# Patient Record
Sex: Female | Born: 1960 | Race: White | Hispanic: No | Marital: Married | Smoking: Former smoker
Health system: Southern US, Community
[De-identification: ages and names within clinical notes are randomized; demographics above are authoritative.]

## PROBLEM LIST (undated history)

## (undated) DIAGNOSIS — I700 Atherosclerosis of aorta: Secondary | ICD-10-CM

## (undated) DIAGNOSIS — D6869 Other thrombophilia: Secondary | ICD-10-CM

## (undated) DIAGNOSIS — J9611 Chronic respiratory failure with hypoxia: Secondary | ICD-10-CM

## (undated) DIAGNOSIS — R0600 Dyspnea, unspecified: Secondary | ICD-10-CM

## (undated) DIAGNOSIS — L519 Erythema multiforme, unspecified: Secondary | ICD-10-CM

## (undated) DIAGNOSIS — N3281 Overactive bladder: Secondary | ICD-10-CM

## (undated) DIAGNOSIS — E039 Hypothyroidism, unspecified: Secondary | ICD-10-CM

## (undated) DIAGNOSIS — I824Z1 Acute embolism and thrombosis of unspecified deep veins of right distal lower extremity: Secondary | ICD-10-CM

## (undated) DIAGNOSIS — J309 Allergic rhinitis, unspecified: Secondary | ICD-10-CM

## (undated) DIAGNOSIS — L03115 Cellulitis of right lower limb: Secondary | ICD-10-CM

## (undated) DIAGNOSIS — F419 Anxiety disorder, unspecified: Secondary | ICD-10-CM

## (undated) DIAGNOSIS — K219 Gastro-esophageal reflux disease without esophagitis: Secondary | ICD-10-CM

## (undated) DIAGNOSIS — G2581 Restless legs syndrome: Secondary | ICD-10-CM

## (undated) DIAGNOSIS — Z9981 Dependence on supplemental oxygen: Secondary | ICD-10-CM

## (undated) DIAGNOSIS — T7840XA Allergy, unspecified, initial encounter: Secondary | ICD-10-CM

## (undated) DIAGNOSIS — G4733 Obstructive sleep apnea (adult) (pediatric): Secondary | ICD-10-CM

## (undated) DIAGNOSIS — R0609 Other forms of dyspnea: Secondary | ICD-10-CM

## (undated) DIAGNOSIS — R0982 Postnasal drip: Secondary | ICD-10-CM

## (undated) DIAGNOSIS — F5104 Psychophysiologic insomnia: Secondary | ICD-10-CM

## (undated) DIAGNOSIS — M1990 Unspecified osteoarthritis, unspecified site: Secondary | ICD-10-CM

## (undated) DIAGNOSIS — J4530 Mild persistent asthma, uncomplicated: Secondary | ICD-10-CM

## (undated) DIAGNOSIS — N959 Unspecified menopausal and perimenopausal disorder: Secondary | ICD-10-CM

## (undated) DIAGNOSIS — F329 Major depressive disorder, single episode, unspecified: Secondary | ICD-10-CM

## (undated) HISTORY — DX: Atherosclerosis of aorta: I70.0

## (undated) HISTORY — DX: Gastro-esophageal reflux disease without esophagitis: K21.9

## (undated) HISTORY — DX: Hypothyroidism, unspecified: E03.9

## (undated) HISTORY — DX: Mild persistent asthma, uncomplicated: J45.30

## (undated) HISTORY — DX: Other forms of dyspnea: R06.09

## (undated) HISTORY — DX: Cellulitis of right lower limb: L03.115

## (undated) HISTORY — DX: Obstructive sleep apnea (adult) (pediatric): G47.33

## (undated) HISTORY — DX: Erythema multiforme, unspecified: L51.9

## (undated) HISTORY — PX: SALIVARY GLAND SURGERY: SHX768

## (undated) HISTORY — DX: Dependence on supplemental oxygen: Z99.81

## (undated) HISTORY — DX: Other thrombophilia: D68.69

## (undated) HISTORY — DX: Psychophysiologic insomnia: F51.04

## (undated) HISTORY — DX: Chronic respiratory failure with hypoxia: J96.11

## (undated) HISTORY — DX: Morbid (severe) obesity due to excess calories: E66.01

## (undated) HISTORY — PX: OTHER SURGICAL HISTORY: SHX169

## (undated) HISTORY — DX: Restless legs syndrome: G25.81

## (undated) HISTORY — DX: Unspecified osteoarthritis, unspecified site: M19.90

## (undated) HISTORY — DX: Major depressive disorder, single episode, unspecified: F32.9

## (undated) HISTORY — DX: Dyspnea, unspecified: R06.00

## (undated) HISTORY — DX: Anxiety disorder, unspecified: F41.9

## (undated) HISTORY — DX: Allergy, unspecified, initial encounter: T78.40XA

## (undated) HISTORY — DX: Acute embolism and thrombosis of unspecified deep veins of right distal lower extremity: I82.4Z1

## (undated) HISTORY — DX: Allergic rhinitis, unspecified: J30.9

## (undated) HISTORY — DX: Overactive bladder: N32.81

## (undated) HISTORY — DX: Postnasal drip: R09.82

## (undated) HISTORY — DX: Unspecified menopausal and perimenopausal disorder: N95.9

---

## 2009-08-20 ENCOUNTER — Ambulatory Visit (HOSPITAL_COMMUNITY): Admission: RE | Admit: 2009-08-20 | Discharge: 2009-08-21 | Payer: Self-pay | Admitting: Neurosurgery

## 2010-06-14 LAB — URINALYSIS, ROUTINE W REFLEX MICROSCOPIC
Glucose, UA: NEGATIVE mg/dL
Hgb urine dipstick: NEGATIVE
Ketones, ur: NEGATIVE mg/dL
Nitrite: NEGATIVE
Urobilinogen, UA: 0.2 mg/dL (ref 0.0–1.0)
pH: 7 (ref 5.0–8.0)

## 2010-06-14 LAB — CBC
Platelets: 233 10*3/uL (ref 150–400)
RBC: 4.59 MIL/uL (ref 3.87–5.11)
RDW: 12.6 % (ref 11.5–15.5)

## 2010-06-14 LAB — BASIC METABOLIC PANEL
BUN: 9 mg/dL (ref 6–23)
CO2: 27 mEq/L (ref 19–32)
Calcium: 8.8 mg/dL (ref 8.4–10.5)
Chloride: 104 mEq/L (ref 96–112)
GFR calc Af Amer: >60 mL/min (ref >60)
Sodium: 137 mEq/L (ref 135–145)

## 2010-06-14 LAB — SURGICAL PCR SCREEN
MRSA, PCR: NEGATIVE
Staphylococcus aureus: POSITIVE — AB

## 2010-06-14 LAB — URINE MICROSCOPIC-ADD ON

## 2012-12-26 ENCOUNTER — Ambulatory Visit (INDEPENDENT_AMBULATORY_CARE_PROVIDER_SITE_OTHER): Payer: BC Managed Care – PPO

## 2012-12-26 VITALS — BP 115/63 | HR 77 | Resp 18

## 2012-12-26 DIAGNOSIS — M722 Plantar fascial fibromatosis: Secondary | ICD-10-CM | POA: Insufficient documentation

## 2012-12-26 DIAGNOSIS — G5762 Lesion of plantar nerve, left lower limb: Secondary | ICD-10-CM | POA: Insufficient documentation

## 2012-12-26 DIAGNOSIS — G5760 Lesion of plantar nerve, unspecified lower limb: Secondary | ICD-10-CM

## 2012-12-26 DIAGNOSIS — M25579 Pain in unspecified ankle and joints of unspecified foot: Secondary | ICD-10-CM

## 2012-12-26 HISTORY — DX: Pain in unspecified ankle and joints of unspecified foot: M25.579

## 2012-12-26 HISTORY — DX: Lesion of plantar nerve, left lower limb: G57.62

## 2012-12-26 HISTORY — DX: Plantar fascial fibromatosis: M72.2

## 2012-12-26 MED ORDER — CELEBREX 200 MG PO CAPS: 200.0000 mg | ORAL_CAPSULE | Freq: Every day | ORAL | Status: DC

## 2012-12-26 MED ORDER — DEHYDRATED ALCOHOL 98 % IV SOLN
1.0000 mL | Freq: Once | INTRAVENOUS | Status: AC
  Administered 2012-12-26: 1 mL via INTRALESIONAL

## 2012-12-26 NOTE — Progress Notes (Signed)
Mrs Mcvicker presents for f/u of neuromas 2nd and 3rd spaces of left foot. A second injection of 4% alcohol in 0.5 % marcaine is deliverred , 1ml to each space. tollerated well , mild discomfort.   Maintain ice packs daily. New rx for Celebrex disp.  fu in 7 days for 3rd in a series of 5 to 7 injections.  Alvan Dame DPM

## 2012-12-26 NOTE — Patient Instructions (Signed)
Celecoxib capsules What is this medicine? CELECOXIB (sell a KOX ib) is a non-steroidal anti-inflammatory drug (NSAID). This medicine is used to treat arthritis and ankylosing spondylitis. It may be also used for pain or painful monthly periods. This medicine may be used for other purposes; ask your health care provider or pharmacist if you have questions. What should I tell my health care provider before I take this medicine? They need to know if you have any of these conditions: -asthma -coronary artery bypass graft (CABG) surgery within the past 2 weeks -drink more than 3 alcohol-containing drinks a day -heart disease or circulation problems like heart failure or leg edema (fluid retention) -high blood pressure -kidney disease -liver disease -stomach bleeding or ulcers -an unusual or allergic reaction to celecoxib, sulfa drugs, aspirin, other NSAIDs, other medicines, foods, dyes, or preservatives -pregnant or trying to get pregnant -breast-feeding How should I use this medicine? Take this medicine by mouth with a full glass of water. Follow the directions on the prescription label. Take it with food if it upsets your stomach or if you take 400 mg at one time. Try to not lie down for at least 10 minutes after you take the medicine. Take the medicine at the same time each day. Do not take more medicine than you are told to take. Long-term, continuous use may increase the risk of heart attack or stroke. A special MedGuide will be given to you by the pharmacist with each prescription and refill. Be sure to read this information carefully each time. Talk to your pediatrician regarding the use of this medicine in children. Special care may be needed. Overdosage: If you think you have taken too much of this medicine contact a poison control center or emergency room at once. NOTE: This medicine is only for you. Do not share this medicine with others. What if I miss a dose? If you miss a dose, take  it as soon as you can. If it is almost time for your next dose, take only that dose. Do not take double or extra doses. What may interact with this medicine? Do not take this medicine with any of the following medications: -cidofovir -methotrexate -other NSAIDs, medicines for pain and inflammation, like ibuprofen or naproxen -pemetrexed This medicine may also interact with the following medications: -alcohol -aspirin and aspirin-like drugs -diuretics -fluconazole -lithium -medicines for high blood pressure -steroid medicines like prednisone or cortisone -warfarin This list may not describe all possible interactions. Give your health care provider a list of all the medicines, herbs, non-prescription drugs, or dietary supplements you use. Also tell them if you smoke, drink alcohol, or use illegal drugs. Some items may interact with your medicine. What should I watch for while using this medicine? Tell your doctor or health care professional if your pain does not get better. Talk to your doctor before taking another medicine for pain. Do not treat yourself. This medicine does not prevent heart attack or stroke. In fact, this medicine may increase the chance of a heart attack or stroke. The chance may increase with longer use of this medicine and in people who have heart disease. If you take aspirin to prevent heart attack or stroke, talk with your doctor or health care professional. Do not take medicines such as ibuprofen and naproxen with this medicine. Side effects such as stomach upset, nausea, or ulcers may be more likely to occur. Many medicines available without a prescription should not be taken with this medicine. This medicine can  cause ulcers and bleeding in the stomach and intestines at any time during treatment. Ulcers and bleeding can happen without warning symptoms and can cause death. What side effects may I notice from receiving this medicine? Side effects that you should report  to your doctor or health care professional as soon as possible: -allergic reactions like skin rash, itching or hives, swelling of the face, lips, or tongue -black or bloody stools, blood in the urine or vomit -blurred vision -breathing problems -chest pain -nausea, vomiting -problems with balance, talking, walking -redness, blistering, peeling or loosening of the skin, including inside the mouth -unexplained weight gain or swelling -unusually weak or tired -yellowing of eyes, skin Side effects that usually do not require medical attention (report to your doctor or health care professional if they continue or are bothersome): -constipation or diarrhea -dizziness -gas or heartburn -upset stomach This list may not describe all possible side effects. Call your doctor for medical advice about side effects. You may report side effects to FDA at 1-800-FDA-1088. Where should I keep my medicine? Keep out of the reach of children. Store at room temperature between 15 and 30 degrees C (59 and 86 degrees F). Keep container tightly closed. Throw away any unused medicine after the expiration date. NOTE: This sheet is a summary. It may not cover all possible information. If you have questions about this medicine, talk to your doctor, pharmacist, or health care provider.  2013, [employer]. (05/13/2009 10:54:17 AM)

## 2013-01-03 ENCOUNTER — Ambulatory Visit (INDEPENDENT_AMBULATORY_CARE_PROVIDER_SITE_OTHER): Payer: BC Managed Care – PPO

## 2013-01-03 VITALS — BP 109/70 | HR 78 | Resp 16

## 2013-01-03 DIAGNOSIS — M722 Plantar fascial fibromatosis: Secondary | ICD-10-CM

## 2013-01-03 DIAGNOSIS — G5760 Lesion of plantar nerve, unspecified lower limb: Secondary | ICD-10-CM

## 2013-01-03 DIAGNOSIS — G5762 Lesion of plantar nerve, left lower limb: Secondary | ICD-10-CM

## 2013-01-03 NOTE — Progress Notes (Signed)
  Subjective:    Patient ID: Darlene Lawson, female    DOB: 1960-09-06, 52 y.o.   MRN: [id number]  HPI patient presents for followup of suspected neuroma second and third intermetatarsal spaces of left foot. Patient had improvement following a steroid and alcohol injections last visit. Presents at this time for her second in a series of 5-7 a call sclerosing injections. Had improvement for several days with recurrence in the last 2 or 3 days if paresthesia and pain. No anterior heel pain is noted at this time plantar fascial symptomology resolved.    Review of Systems  Constitutional: Negative.   HENT: Negative.   Eyes: Negative.   Respiratory: Negative.   Endocrine: Negative.   Genitourinary: Positive for urgency.  Allergic/Immunologic: Negative.   Neurological: Negative.   Hematological: Bruises/bleeds easily.  Psychiatric/Behavioral: Negative.        Objective:   Physical Exam  Constitutional: She is oriented to person, place, and time. She appears well-developed and well-nourished.  Cardiovascular: Intact distal pulses.   Capillary refill 3-4 seconds all digits skin temperature warm bilateral  Musculoskeletal: Normal range of motion.  Muscle strength intact and symmetric bilateral no major osseous abnormalities bilateral mild flexible digital contractures left foot still present. Plantar fascial symptomology nonreproducible no complaint of pain anterior left heel at this time. Still has pain and direct lateral compression second and third intermetatarsal spaces left foot  Neurological: She is alert and oriented to person, place, and time. She has normal strength and normal reflexes.  Epicritic and proprioceptive sensations intact and symmetric bilateral. There is pain and paresthesia on direct compression second and third interspaces left foot consistent with Morton's neuroma. Improvement after last week's injection for several days with recent recurrence of symptoms.  Skin: Skin is  warm and dry. No cyanosis. Nails show no clubbing.  Nails normal trophic skin texture and color and turgor normal hair growth absent. No notable ecchymosis injection sites.  Psychiatric: She has a normal mood and affect. Her behavior is normal. Judgment and thought content normal.          Assessment & Plan:  Morton's neuroma second interspace left foot. Improving with a call sclerosing injections. Plantar fasciitis stable no symptoms at this time.   A second series of injections total of 1 cc 4% dehydration alcohol in 0.5% alcohol is delivered into each of the second and third intermetatarsal space of the left foot. Patient tolerated the injections well. Instructed in ice application daily. Maintain NSAID therapy as needed. Return in 1 week for third injection in the series.  Alvan Dame DPM

## 2013-01-03 NOTE — Patient Instructions (Signed)
ICE INSTRUCTIONS  Apply ice or cold pack to the affected area at least 3 times a day for 10-15 minutes each time.  You should also use ice after prolonged activity or vigorous exercise.  Do not apply ice longer than 20 minutes at one time.  Always keep a cloth between your skin and the ice pack to prevent burns.  Being consistent and following these instructions will help control your symptoms.  We suggest you purchase a gel ice pack because they are reusable and do bit leak.  Some of them are designed to wrap around the area.  Use the method that works best for you.  Here are some other suggestions for icing.   Use a frozen bag of peas or corn-inexpensive and molds well to your body, usually stays frozen for 10 to 20 minutes. Wet a towel with cold water and squeeze out the excess until it's damp.  Place in a bag in the freezer for 20 minutes. Then remove and use.Morton's Neuroma Neuralgia (nerve pain) or neuroma (benign [non-cancerous] nerve tumor) may develop on any interdigital nerve. The interdigital nerves (nerves between digits) of the foot travel beneath and between the metatarsals (long bones of the fore foot) and pass the nerve endings to the toes. The third interdigital is a common place for a small neuroma to form called Morton's neuroma. Another nerve to be affected commonly is the fourth interdigital nerve. This would be in approximately in the area of the base or ball under the bottom of your fourth toe. This condition occurs more commonly in women and is usually on one side. It is usually first noticed by pain radiating (spreading) to the ball of the foot or to the toes. CAUSES The cause of interdigital neuralgia may be from low grade repetitive trauma (damage caused by an accident) as in activities causing a repeated pounding of the foot (running, jumping etc.). It is also caused by improper footwear or recent loss of the fatty padding on the bottom of the foot. TREATMENT  The condition  often resolves (goes away) simply with decreasing activity if that is thought to be the cause. Proper shoes are beneficial. Orthotics (special foot support aids) such as a metatarsal bar are often beneficial. This condition usually responds to conservative therapy, however if surgery is necessary it usually brings complete relief. HOME CARE INSTRUCTIONS   Apply ice to the area of soreness for 15-20 minutes, 3-4 times per day, while awake for the first 2 days. Put ice in a plastic bag and place a towel between the bag of ice and your skin.  Only take over-the-counter or prescription medicines for pain, discomfort, or fever as directed by your caregiver. MAKE SURE YOU:   Understand these instructions.  Will watch your condition.  Will get help right away if you are not doing well or get worse. Document Released: 06/20/2000 Document Revised: 06/06/2011 Document Reviewed: 03/14/2005 [facility] Patient Information 2014 [place], [place].

## 2013-01-09 ENCOUNTER — Ambulatory Visit (INDEPENDENT_AMBULATORY_CARE_PROVIDER_SITE_OTHER): Payer: BC Managed Care – PPO

## 2013-01-09 VITALS — BP 122/73 | HR 75 | Resp 18

## 2013-01-09 DIAGNOSIS — G5760 Lesion of plantar nerve, unspecified lower limb: Secondary | ICD-10-CM

## 2013-01-09 DIAGNOSIS — G5762 Lesion of plantar nerve, left lower limb: Secondary | ICD-10-CM

## 2013-01-09 NOTE — Patient Instructions (Signed)

## 2013-01-09 NOTE — Progress Notes (Signed)
  Subjective:    Patient ID: Darlene Lawson, female    DOB: 10-24-1960, 52 y.o.   MRN: [id number]  HPI yesterday i started feeling pain, not too bad but if i stepped wrong i can feel it and i have been icing it. Patient had one steroid injection in his had 2 weeks of alcohol injections thus far. Continues to have pain on direct lateral compression second and third intermetatarsal spaces of left foot. There is no he'll arch pain at this time responding well.    Review of Systems  Constitutional: Negative.   HENT: Negative.   Eyes: Negative.   Respiratory: Negative.   Cardiovascular: Negative.   Gastrointestinal: Negative.   Endocrine: Negative.   Genitourinary: Negative.   Musculoskeletal: Negative.   Skin: Negative.   Allergic/Immunologic: Negative.   Neurological: Negative.   Hematological: Negative.   Psychiatric/Behavioral: Negative.        Objective:   Physical Exam Neurovascular status is intact. Pedal pulses palpable epicritic and proprioceptive sensations intact. There is hyperesthesia direct compression second and third interspaces left foot consistent with Morton's neuroma. Patient had relief temporarily following the injections however by the end of the week pain symptoms recurred. She also the patient has semirigid digital contractures 2 through 4 left foot. Maintain appropriate accommodative shoes.       Assessment & Plan:  Assessment Morton's neuroma second and third metatarsal spaces left foot. Injection 1 cc 4% dehydrated alcohol and 0.5% Marcaine plain delivered into each of the second and third interspaces of the left foot. This block delivered to the second and third common plantar digital nerves. Patient will continue with ice compress and Celebrex as instructed, and maintain accommodative shoes. Reappointed one week for fourth series of injections. Next  Tana Felts DPM

## 2013-01-16 ENCOUNTER — Ambulatory Visit (INDEPENDENT_AMBULATORY_CARE_PROVIDER_SITE_OTHER): Payer: BC Managed Care – PPO

## 2013-01-16 VITALS — BP 124/79 | HR 83 | Resp 18

## 2013-01-16 DIAGNOSIS — G5762 Lesion of plantar nerve, left lower limb: Secondary | ICD-10-CM

## 2013-01-16 DIAGNOSIS — M722 Plantar fascial fibromatosis: Secondary | ICD-10-CM

## 2013-01-16 DIAGNOSIS — G5760 Lesion of plantar nerve, unspecified lower limb: Secondary | ICD-10-CM

## 2013-01-16 NOTE — Patient Instructions (Signed)

## 2013-01-16 NOTE — Progress Notes (Signed)
  Subjective:    Patient ID: Darlene Lawson, female    DOB: [birth year], 52 y.o.   MRN: [id number]  HPI Better than we was and the closer we get to getting a shot i can tell its there Patient presents for fourth a series of 57 alcohol injections for suspect Morton's neuroma second and third interspace left foot  Review of Systems  Constitutional: Negative.   HENT: Negative.   Eyes: Negative.   Respiratory: Negative.   Cardiovascular: Negative.   Gastrointestinal: Negative.   Endocrine: Negative.   Genitourinary: Negative.   Musculoskeletal: Negative.   Skin: Negative.   Allergic/Immunologic: Negative.   Neurological: Negative.   Hematological: Negative.   Psychiatric/Behavioral: Negative.        Objective:   Physical Exam Neurovascular status unchanged. No notable ecchymosis or edema. Following the third injection there is significant improvement up until late Monday and early Tuesday when some neuropathy pain started coming back. Still some mild paresthesia and neuroma symptoms on direct lateral compression second and third interspace although not as intense. Decreased sensation in the third web spaces noted.       Assessment & Plan:  Improving symptomology with alcohol sclerosing injections. A fourth in a series of 5-7 alcohol injections delivered at this time. A total of 1 cc of 4% dehydrated alcohol and 0.5% Marcaine plain is infiltrated into each of the second and third intermetatarsal space of left foot. Patient tolerated the injection well, and will continue icing the area and maintain Celebrex/Tylenol for pain. Reappointed in one week for a fifth injection after which a one-month break will be utilized. We'll consider sixth or seventh injections based on improvement her progress at that time. Next  Alvan Dame DPM

## 2013-01-23 ENCOUNTER — Ambulatory Visit (INDEPENDENT_AMBULATORY_CARE_PROVIDER_SITE_OTHER): Payer: BC Managed Care – PPO

## 2013-01-23 VITALS — BP 119/69 | HR 79 | Resp 18

## 2013-01-23 DIAGNOSIS — L600 Ingrowing nail: Secondary | ICD-10-CM

## 2013-01-23 DIAGNOSIS — G5762 Lesion of plantar nerve, left lower limb: Secondary | ICD-10-CM

## 2013-01-23 DIAGNOSIS — G5760 Lesion of plantar nerve, unspecified lower limb: Secondary | ICD-10-CM

## 2013-01-23 DIAGNOSIS — L03039 Cellulitis of unspecified toe: Secondary | ICD-10-CM

## 2013-01-23 MED ORDER — CLINDAMYCIN HCL 150 MG PO CAPS: 150.0000 mg | ORAL_CAPSULE | Freq: Three times a day (TID) | ORAL | Status: DC

## 2013-01-23 NOTE — Patient Instructions (Signed)

## 2013-01-23 NOTE — Progress Notes (Signed)
  Subjective:     Patient ID: Darlene Lawson, female    DOB: August 30, 1960, 52 y.o.   MRN: [id number] The left 2nd and 3rd toes are hurting when i am walking and using the ice packs and it better but i  HPI can feel it and the 2nd toe on the right has a place on the nail and has been going on for about 6 months and i get pedicures and is sore to touch  Patients have several month history of ingrowing nail of her medial border second toe right foot there's been no discharge or drainage however is been swollen and tender. This started after she started having pedicures.   Review of Systems deferred at this time     Objective:   Physical Exam Neurovascular status unchanged. Pedal pulses palpable dorsalis pedis and PT bilateral. There continues to pain second and third toes second interspace left foot. Slight edema and erythema medial border second toe right foot no discharge or drainage noted. There continues to be pain on direct lateral compression of the left foot second third intermetatarsal spaces no ecchymosis is noted.    Assessment & Plan:  #1 Morton's neuroma patient presents this time for fifth injection alcohol sclerosing agent. A fifth injection total of 1 cc 4% dehydrated alcohol and 0.5% Marcaine is infiltrated to each of the second and third interspaces of the left foot. Recommend a six-week followup for possible sixth or seventh injections based on progress and findings at that time. If he fails to improve additional injections and recommended, or possible surgical polyps may also recommended.  #2 ingrowing nail second toe right foot: Patient presents with a complaint of ingrown second toenail with possibly early paronychia at this time recommended daily soaks in soap and water or Epsom salts in warm water. Prescription for clindamycin was dispensed 3 times a day x10 days 150 mg. Followup with the next 4-6 weeks for reevaluation. Discussed briefly the possibility of a nail partial nail  excision if needed  Alvan Dame DPM

## 2013-02-25 HISTORY — PX: LAMINECTOMY: SHX219

## 2013-03-06 ENCOUNTER — Ambulatory Visit: Payer: BC Managed Care – PPO

## 2013-07-16 ENCOUNTER — Other Ambulatory Visit: Payer: Self-pay

## 2014-02-24 ENCOUNTER — Ambulatory Visit (INDEPENDENT_AMBULATORY_CARE_PROVIDER_SITE_OTHER): Payer: BC Managed Care – PPO

## 2014-02-24 VITALS — BP 108/68 | HR 89 | Resp 18

## 2014-02-24 DIAGNOSIS — M2012 Hallux valgus (acquired), left foot: Secondary | ICD-10-CM

## 2014-02-24 DIAGNOSIS — M2042 Other hammer toe(s) (acquired), left foot: Secondary | ICD-10-CM

## 2014-02-24 DIAGNOSIS — R52 Pain, unspecified: Secondary | ICD-10-CM

## 2014-02-24 DIAGNOSIS — G5762 Lesion of plantar nerve, left lower limb: Secondary | ICD-10-CM

## 2014-02-24 DIAGNOSIS — M722 Plantar fascial fibromatosis: Secondary | ICD-10-CM

## 2014-02-24 NOTE — Progress Notes (Signed)
   Subjective:    Patient ID: Darlene Lawson, female    DOB: [birth year], 53 y.o.   MRN: [id number]  HPI MY LEFT FOOT IS NO BETTER AND IT HURTS ON THE 2ND AND 3RD TOES AND HAS A WEIRD FEELING AND ON THE BALL OF MY LEFT FOOT IT FEELS LIKE THE BONE IS COMING OUT AND I WEAR WIDE SHOES AND THE 4TH AND 5TH TOES ARE STARTING TO HURT    Review of Systems no new findings or systemic changes noted     Objective:   Physical Exam 53 year old white female well-developed well-nourished oriented 3 presents at this time for follow-up patient's had a series of 5 alcohol injections to the left second and third interspace for Morton's neuroma however indicates her foot still is painful as never she having pain up in the toes to the rigid contractures of the second and third toes she to walk inside of her foot neck she aggravated the fourth and fifth toe as a result of the gait changes patient and these have notable bunion deformity as well as lateral deviation hallux as well as dorsal displacement of the second and third toes with pain and capsulitis at the MTP as well as IP joints of the toes being noted. Next  Neurovascular status is intact pedal pulses are palpable DP +2 PT +2 over 4 Refill time 3 seconds epicritic and proprioceptive sensations intact hyperesthesia to the toes being noted. 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 Additional sesamoid position 4-5 there is adductovarus rotation of the fourth and fifth toes are are flexible and asymptomatic at current time. 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 with  capsulitis of the MTP joints as well as suspect neuroma symptomology second and third interspaces continues to be a problem plan at this time patient is requesting surgical intervention help to leaving promise completely does not require any additional injections therefore patient declines injections and wished to proceed with surgery at this time a consent form for [facility] repair second and third left with sequential reductions and neurectomy second and third interspaces left foot risk L Turner's reviewed all questions asked by the patient are answered there no contraindications to surgery and surgery rescheduled her convenience with appropriate follow-up thereafter she'll be out of work for at least 6-8 weeks as instructed will be in air fracture boot for at least 6 weeks. There we pins in the second and third toes as well as buried K wire in the first metatarsal. There were medications and surgery will be scheduled  Harriet Masson DPM

## 2014-02-24 NOTE — Patient Instructions (Addendum)
Pre-Operative Instructions  Congratulations, you have decided to take an important step to improving your quality of life.  You can be assured that the doctors of [facility] will be with you every step of the way.  1. Plan to be at the surgery center/hospital at least 1 (one) hour prior to your scheduled time unless otherwise directed by the surgical center/hospital staff.  You must have a responsible adult accompany you, remain during the surgery and drive you home.  Make sure you have directions to the surgical center/hospital and know how to get there on time. 2. For hospital based surgery you will need to obtain a history and physical form from your family physician within 1 month prior to the date of surgery- we will give you a form for you primary physician.  3. We make every effort to accommodate the date you request for surgery.  There are however, times where surgery dates or times have to be moved.  We will contact you as soon as possible if a change in schedule is required.   4. No Aspirin/Ibuprofen for one week before surgery.  If you are on aspirin, any non-steroidal anti-inflammatory medications (Mobic, Aleve, Ibuprofen) you should stop taking it 7 days prior to your surgery.  You make take Tylenol  For pain prior to surgery.  5. Medications- If you are taking daily heart and blood pressure medications, seizure, reflux, allergy, asthma, anxiety, pain or diabetes medications, make sure the surgery center/hospital is aware before the day of surgery so they may notify you which medications to take or avoid the day of surgery. 6. No food or drink after midnight the night before surgery unless directed otherwise by surgical center/hospital staff. 7. No alcoholic beverages 24 hours prior to surgery.  No smoking 24 hours prior to or 24 hours after surgery. 8. Wear loose pants or shorts- loose enough to fit over bandages, boots, and casts. 9. No slip on shoes, sneakers are best. 10. Bring  your boot with you to the surgery center/hospital.  Also bring crutches or a walker if your physician has prescribed it for you.  If you do not have this equipment, it will be provided for you after surgery. 11. If you have not been contracted by the surgery center/hospital by the day before your surgery, call to confirm the date and time of your surgery. 12. Leave-time from work may vary depending on the type of surgery you have.  Appropriate arrangements should be made prior to surgery with your employer. 13. Prescriptions will be provided immediately following surgery by your doctor.  Have these filled as soon as possible after surgery and take the medication as directed. 14. Remove nail polish on the operative foot. 15. Wash the night before surgery.  The night before surgery wash the foot and leg well with the antibacterial soap provided and water paying special attention to beneath the toenails and in between the toes.  Rinse thoroughly with water and dry well with a towel.  Perform this wash unless told not to do so by your physician.  Enclosed: 1 Ice pack (please put in freezer the night before surgery)   1 Hibiclens skin cleaner   Pre-op Instructions  If you have any questions regarding the instructions, do not hesitate to call our office.  [place]: [address] [place], [place] [address] [phone]  [place]: [address] [place], [place] [address] [phone]  [place]: [address]  [place], [place] [address] [phone]  Dr. Keitra Carusone   Tuchman DPM, Dr. Ila Mcgill DPM Dr. Harriet Masson DPM, Dr. Lanelle Bal DPM, Dr. Trudie Buckler DPM     ICE INSTRUCTIONS  Apply ice or cold pack to the affected area at least 3 times a day for 10-15 minutes each time.  You should also use ice after prolonged activity or vigorous exercise.  Do not apply ice longer than 20 minutes at one time.  Always keep a cloth between your skin and the ice pack to prevent burns.  Being consistent and  following these instructions will help control your symptoms.  We suggest you purchase a gel ice pack because they are reusable and do bit leak.  Some of them are designed to wrap around the area.  Use the method that works best for you.  Here are some other suggestions for icing.   Use a frozen bag of peas or corn-inexpensive and molds well to your body, usually stays frozen for 10 to 20 minutes.  Wet a towel with cold water and squeeze out the excess until it's damp.  Place in a bag in the freezer for 20 minutes. Then remove and use.

## 2014-04-09 DIAGNOSIS — M2012 Hallux valgus (acquired), left foot: Secondary | ICD-10-CM

## 2014-04-09 DIAGNOSIS — G5762 Lesion of plantar nerve, left lower limb: Secondary | ICD-10-CM

## 2014-04-09 DIAGNOSIS — M2042 Other hammer toe(s) (acquired), left foot: Secondary | ICD-10-CM

## 2014-04-17 ENCOUNTER — Ambulatory Visit (INDEPENDENT_AMBULATORY_CARE_PROVIDER_SITE_OTHER): Payer: BLUE CROSS/BLUE SHIELD

## 2014-04-17 VITALS — BP 117/79 | HR 71 | Resp 18

## 2014-04-17 DIAGNOSIS — M2012 Hallux valgus (acquired), left foot: Secondary | ICD-10-CM

## 2014-04-17 DIAGNOSIS — D3610 Benign neoplasm of peripheral nerves and autonomic nervous system, unspecified: Secondary | ICD-10-CM

## 2014-04-17 DIAGNOSIS — Z09 Encounter for follow-up examination after completed treatment for conditions other than malignant neoplasm: Secondary | ICD-10-CM

## 2014-04-17 DIAGNOSIS — M2042 Other hammer toe(s) (acquired), left foot: Secondary | ICD-10-CM

## 2014-04-17 NOTE — Progress Notes (Signed)
   Subjective:    Patient ID: Darlene Lawson, female    DOB: [birth year], 54 y.o.   MRN: [id number]  HPI I HAD SURGERY ON 04/09/14 ON MY LEFT FOOT AND IT FEELS LIKE THERE IS SOMETHING ON THE BOTTOM OF MY FOOT AND THE PAIN MEDICINE IS NOT THAT GREAT    Review of Systems no new findings or systemic changes noted     Objective:   Physical Exam Neurovascular status is intact pedal pulses are palpable patient is a day status post [place] bunionectomy left foot as well as hammertoe repair second and third left and neurectomy second and third interspace left foot incisions clean dry well coapted dressings intact and dry. No dehiscence no discharge no drainage noted. The and ecchymosis consistent with postop course noted. X-rays reveal good position alignment of the osteotomies and K wire fixations. Clinically toes appear to good rectus position and alignment as does the hallux. No open wounds no ulcers. Patient is having some slight aching and throbbing assistant with postop swelling and edema and tight bandaging this time was bandage was changed had some improvement in pain sensation will advised to take some at 2 Advil 3 times a day as needed for pain and for throbbing also elevated and ice as recommended. Reappointed one week plan for suture removal within the next X she proxy 10 or 11 days for suture removal.       Assessment & Plan:  Assessment good postop progress following bunionectomy and hammertoe repair and neurectomy left foot maintain Advil and ice elevate when possible dressed a dressing reapplied maintain air fracture boot as instructed pins will be in place for at least another 4 more weeks suture removal within the next week or 10 days. Recommended Tylenol or Advil as needed for pain patient ambulating comfortably with the boot intact next  Harriet Masson DPM

## 2014-04-17 NOTE — Patient Instructions (Signed)

## 2014-04-28 ENCOUNTER — Ambulatory Visit (INDEPENDENT_AMBULATORY_CARE_PROVIDER_SITE_OTHER): Payer: BLUE CROSS/BLUE SHIELD

## 2014-04-28 DIAGNOSIS — Z09 Encounter for follow-up examination after completed treatment for conditions other than malignant neoplasm: Secondary | ICD-10-CM

## 2014-04-28 DIAGNOSIS — M2012 Hallux valgus (acquired), left foot: Secondary | ICD-10-CM

## 2014-04-28 DIAGNOSIS — M2042 Other hammer toe(s) (acquired), left foot: Secondary | ICD-10-CM

## 2014-04-28 DIAGNOSIS — L600 Ingrowing nail: Secondary | ICD-10-CM

## 2014-04-28 DIAGNOSIS — D3610 Benign neoplasm of peripheral nerves and autonomic nervous system, unspecified: Secondary | ICD-10-CM

## 2014-04-28 NOTE — Patient Instructions (Signed)
ICE INSTRUCTIONS  Apply ice or cold pack to the affected area at least 3 times a day for 10-15 minutes each time.  You should also use ice after prolonged activity or vigorous exercise.  Do not apply ice longer than 20 minutes at one time.  Always keep a cloth between your skin and the ice pack to prevent burns.  Being consistent and following these instructions will help control your symptoms.  We suggest you purchase a gel ice pack because they are reusable and do bit leak.  Some of them are designed to wrap around the area.  Use the method that works best for you.  Here are some other suggestions for icing.   Use a frozen bag of peas or corn-inexpensive and molds well to your body, usually stays frozen for 10 to 20 minutes.  Wet a towel with cold water and squeeze out the excess until it's damp.  Place in a bag in the freezer for 20 minutes. Then remove and use.   Patient may resume normal bathing and hygiene wash the foot daily with soap and water reapply Coflex wrap to the toes buddy wrapping toes 23 and 4 as instructed. Also maintain compression stocking every day may remove the stocking in the evening to have wash and air dry reapply the Sarles stocking every day to help keep down and swelling. Maintain Neosporin are cocoa butter to the incision areas to help reduce scarring. Maintain boot at all times for walking or standing.

## 2014-04-28 NOTE — Progress Notes (Signed)
   Subjective:    Patient ID: Darlene Lawson, female    DOB: 1961-02-11, 54 y.o.   MRN: [id number]  HPI I AM STILL HAVE SOME SWELLING     Review of Systems no new findings or systemic changes noted    Objective:   Physical Exam Neurovascular status is intact pedal pulses are palpable incisions clean dry well coapted dressings are intact and dry sutures removed at this time. Neosporin applied to the incision areas and pin sites Coflex wrapping of toes 23 and 4 and a buddy wrap fashion was done and Coflex is dispensed to be maintained by patient daily. May resume normal bathing and hygiene however maintain air fracture boot for ambulation and standing. Reappointed in 2-3 weeks for follow-up x-ray and likely pin removal at that time. At this time patient is also advised to keep the foot elevated and maintain compression anklet which is dispensed to help with the swelling and edema which is still present. Elevated ice and maintain anklet everyday maintain wash the anklet daily and reapply in the morning.       Assessment & Plan:  Assessment good postop progress neurovascular status is intact incision is well coapted no dehiscence no discharge no drainage dispensed anklet and Coflex wrapping to maintain compression may resume normal bathing and hygiene reappointed 2-3 weeks plan for follow-up x-ray and pin removal at that time. Blenda Mounts DPM

## 2014-05-02 DIAGNOSIS — M2012 Hallux valgus (acquired), left foot: Secondary | ICD-10-CM

## 2014-05-12 ENCOUNTER — Ambulatory Visit (INDEPENDENT_AMBULATORY_CARE_PROVIDER_SITE_OTHER): Payer: BLUE CROSS/BLUE SHIELD

## 2014-05-12 VITALS — BP 122/77 | HR 83 | Resp 18

## 2014-05-12 DIAGNOSIS — R52 Pain, unspecified: Secondary | ICD-10-CM

## 2014-05-12 DIAGNOSIS — Z09 Encounter for follow-up examination after completed treatment for conditions other than malignant neoplasm: Secondary | ICD-10-CM

## 2014-05-12 DIAGNOSIS — L600 Ingrowing nail: Secondary | ICD-10-CM

## 2014-05-12 DIAGNOSIS — M2012 Hallux valgus (acquired), left foot: Secondary | ICD-10-CM

## 2014-05-12 DIAGNOSIS — M2042 Other hammer toe(s) (acquired), left foot: Secondary | ICD-10-CM

## 2014-05-12 MED ORDER — CLINDAMYCIN HCL 150 MG PO CAPS: 150.0000 mg | ORAL_CAPSULE | Freq: Three times a day (TID) | ORAL | Status: DC

## 2014-05-12 NOTE — Patient Instructions (Signed)

## 2014-05-12 NOTE — Progress Notes (Signed)
   Subjective:    Patient ID: Darlene Lawson, female    DOB: 1960-05-14, 54 y.o.   MRN: [id number]  HPI MY LEFT FOOT IS STILL SWELLING AND IS STILL SORE AND TENDER AND IS NUMB IN SOME AREAS AND MY RIGHT BIG TOENAIL IS INGROWN AND I THINK WE ARE DOING THAT TODAY AS WELL    Review of Systems no new findings or systemic changes noted.     Objective:   Physical Exam D54-year-old feel since this time she is 5 weeks status post bunionectomy of left foot as well as hammertoe repair second third toes left foot with K wire fixation. X-rays reveal good position alignment of the osteotomy and fixation intact, second and third toes. This time patient ready for pin removal again incisions well coapted pin sites well no dehiscence no discharge drainage no other complaint patient does have is a summary from her surgery on her left foot if she wants to have her ingrowing nail medial lateral borders of the right hallux nail removed this is an ongoing problem pain with discomfort shoes and activities and ambulation we discussed this previously and surgery for the nail to take place today following her postop check. Neurovascular status is intact bilateral pedal pulses are palpable DP +2 PT plus one over 4 bilateral capillary refill time 3 seconds all digits epicritic and proprioceptive sensations intact and symmetric. There is normal plantar response DTRs not listed this time patient pin sites on the left foot are prepped with Betadine Neosporin pins are removed Neosporin and Band-Aid dressings applied with Coflex wrapping of buddy wrapping toes 23 and 4 carried out on the left foot new ankle is also dispensed to maintain compression left foot may discontinue air fracture boot and return to walking tennis or athletic shoes she wearing Birkenstocks today. Graft on the right foot patient does have pedal pulses are palpable ingrowing criptotic incurvated friable nail first right. At this time per patient request my  recommendation will undergo AP nail procedure with phenol matricectomy we discussed the risks, case is postop care involving daily soaks and Betadine warm water and also clindamycin for 10 days as instructed.       Assessment & Plan:  Assessment #1 good postop progress left foot following bunionectomy and hammertoe repair pins are removed discontinue boot and return to walking tennis or athletic shoe return in 2 months for long-term follow-up on the left foot. Maintain moderate activities continue with aggressive and active range of motion exercises of the great toe on left foot. Neck  Assessment #2 ingrowing nail right great toe. This time local anesthetic block symmetrical to the right great toe Betadine first performed medial lateral borders are excised phenol matricectomy followed by alcohol wash Betadine ointment and dry sterile dressing applied to the right great toe. Soak in daily as instructed Epson salts or Betadine in warm water soaks. Tylenol aspirin or Advil as needed for pain return in proxy 2-3 weeks for nail check on the right great toe. Prescription for clindamycin 150 mg dispensed 30. 1 by mouth 3 times a day.  Harriet Masson DPM

## 2014-05-26 ENCOUNTER — Ambulatory Visit (INDEPENDENT_AMBULATORY_CARE_PROVIDER_SITE_OTHER): Payer: BLUE CROSS/BLUE SHIELD

## 2014-05-26 VITALS — BP 116/72 | HR 90 | Temp 99.6°F | Resp 18

## 2014-05-26 DIAGNOSIS — Z09 Encounter for follow-up examination after completed treatment for conditions other than malignant neoplasm: Secondary | ICD-10-CM

## 2014-05-26 DIAGNOSIS — M2012 Hallux valgus (acquired), left foot: Secondary | ICD-10-CM

## 2014-05-26 DIAGNOSIS — M2042 Other hammer toe(s) (acquired), left foot: Secondary | ICD-10-CM

## 2014-05-26 DIAGNOSIS — D3610 Benign neoplasm of peripheral nerves and autonomic nervous system, unspecified: Secondary | ICD-10-CM

## 2014-05-26 DIAGNOSIS — L600 Ingrowing nail: Secondary | ICD-10-CM

## 2014-05-26 NOTE — Progress Notes (Signed)
   Subjective:    Patient ID: Darlene Lawson, female    DOB: Mar 01, 1961, 54 y.o.   MRN: [id number]  HPI MY RIGHT TOENAIL IS DOING BETTER IT WAS RED AND DRAINING AND ALL OF A SUDDEN MY LEFT FOOT IS SWELLING BAD AND THE 2ND TOE SEEMS TO BE TURNING TOWARD MY 3RD TOE    Review of Systems no new findings or systemic changes     Objective:   Physical Exam Neurovascular status is intact pedal pulses are palpable patient is two-week status post AP nail medial lateral border the right great toe doing well mild serous drainage still present Neosporin and Band-Aids applied maintain Neosporin and Band-Aid during the day where dry at night. As far as the left foot patient has some increased swelling in the toes although patient is has a slight fever 99.6. Patient just feels some generalized malaise there is no increased temperature fever on either foot I cellulitis or lymphangitis noted. Dema the toes consistent with postop course although clinically the foot is doing well good range of motion of the hallux and patient had healed incisions between the second third and third and fourth toes. Again patient is undergone neurectomy as well as hammertoe repairs multiple digits continue with Coflex wrapping additional cancer anklets are dispensed to maintain compression.       Assessment & Plan:  Assessment good postoperative for AP nail procedure maintain Neosporin and Band-Aid during the day where dry at night continue with soaking his lungs or drainage. Recheck in 2 months for long-term follow-up on the left foot following bunion and hammertoe surgeries maintain Coflex wrapping maintain anklet for compression contact us immediately if is any changes or exacerbations at any time monitor her temperature any fever chills contact us immediately or go to the emergency room fitters any cellulitis lymphangitis or any fever chills whether is a foot related a respiratory issue dressed. Otherwise indicates her diabetes is  been stable recheck in 2 months for long-term follow-up and x-ray  Harriet Masson DPM

## 2014-07-01 MED ORDER — CELECOXIB 200 MG PO CAPS: 200.0000 mg | ORAL_CAPSULE | Freq: Two times a day (BID) | ORAL | Status: DC

## 2014-07-01 NOTE — Addendum Note (Signed)
Addended by: Cranford Mon R on: 07/01/2014 04:20 PM   Modules accepted: Orders

## 2014-07-10 ENCOUNTER — Encounter: Payer: Self-pay | Admitting: Podiatrist

## 2014-07-10 ENCOUNTER — Ambulatory Visit (INDEPENDENT_AMBULATORY_CARE_PROVIDER_SITE_OTHER): Payer: BLUE CROSS/BLUE SHIELD

## 2014-07-10 ENCOUNTER — Ambulatory Visit (INDEPENDENT_AMBULATORY_CARE_PROVIDER_SITE_OTHER): Payer: BLUE CROSS/BLUE SHIELD | Admitting: Podiatrist

## 2014-07-10 VITALS — BP 112/77 | HR 83 | Resp 18

## 2014-07-10 DIAGNOSIS — D3610 Benign neoplasm of peripheral nerves and autonomic nervous system, unspecified: Secondary | ICD-10-CM

## 2014-07-10 DIAGNOSIS — M2012 Hallux valgus (acquired), left foot: Secondary | ICD-10-CM

## 2014-07-10 DIAGNOSIS — Z09 Encounter for follow-up examination after completed treatment for conditions other than malignant neoplasm: Secondary | ICD-10-CM

## 2014-07-11 NOTE — Progress Notes (Signed)
Patient presents today for final follow-up status post [facility] bunion correction of the left foot with hammertoe repair digits 2 and 3 of the left foot date of surgery was 04/09/2014. She states overall she is doing very well and is happy with the procedure.  Objective neurovascular status is intact excellent clinical and radiographic alignment of the first metatarsophalangeal joint as well as the second third metatarsophalangeal joint greater range of motion is noted left foot. Incision sites are well healed. Minimal swelling is noted.  Assessment: Status post left foot surgery  Plan: Recommended continued range of motion exercises in good supportive shoes. She may return her normal activities as tolerated. At this time she is discharged from her postoperative course. If any concerns arise she will call.

## 2014-08-18 ENCOUNTER — Telehealth: Payer: Self-pay

## 2014-08-18 NOTE — Telephone Encounter (Signed)
Patient would like a CB requested to talk to [place] regarding her foot

## 2014-08-19 NOTE — Telephone Encounter (Signed)
Called patient and stated that if she needed to speak with me just to call the [place] office and ask for me. Kyna Blahnik

## 2014-10-01 ENCOUNTER — Ambulatory Visit (INDEPENDENT_AMBULATORY_CARE_PROVIDER_SITE_OTHER): Payer: BLUE CROSS/BLUE SHIELD | Admitting: Podiatry

## 2014-10-01 ENCOUNTER — Encounter: Payer: Self-pay | Admitting: Podiatry

## 2014-10-01 ENCOUNTER — Ambulatory Visit: Payer: BLUE CROSS/BLUE SHIELD

## 2014-10-01 VITALS — BP 131/84 | HR 82 | Resp 12

## 2014-10-01 DIAGNOSIS — M779 Enthesopathy, unspecified: Secondary | ICD-10-CM

## 2014-10-01 DIAGNOSIS — Z09 Encounter for follow-up examination after completed treatment for conditions other than malignant neoplasm: Secondary | ICD-10-CM

## 2014-10-01 DIAGNOSIS — M2042 Other hammer toe(s) (acquired), left foot: Secondary | ICD-10-CM

## 2014-10-01 MED ORDER — METHYLPREDNISOLONE 4 MG PO TBPK: ORAL_TABLET | ORAL | Status: DC

## 2014-10-01 NOTE — Progress Notes (Signed)
Subjective:     Patient ID: Darlene Lawson, female   DOB: [birth year], 54 y.o.   MRN: [id number]  HPIThis patient returns to the office saying she has severe pain in her left forefoot.  She had [place] bunionectom, hammer toe correction 2,3 left  in January.  She initially was doing well but then she developed pain in second and third toes left foot and behind her toe joints.  She was seen by Dr. Valentina Lucks who diagnosed a capsulitis and treated her with injection therapy.  This helped temporarily.  She now feels that her bones are coming through the bottom of her foot.It is painful to walk.  She presents for evaluation and treatment.   Review of Systems     Objective:   Physical Exam Objective: Review of past medical history, medications, social history and allergies were performed.  Vascular: Dorsalis pedis and posterior tibial pulses were palpable B/L, capillary refill was  WNL B/L, temperature gradient was WNL B/L   Skin:  No signs of symptoms of infection or ulcers on both feet  Nails: appear healthy with no signs of mycosis or infections  Sensory: Semmes Weinstein monifilament WNL   Orthopedic: Orthopedic evaluation demonstrates all joints distal t ankle have full ROM without crepitus, muscle power WNL B/L.  There is a purplish discoloration between her second and third metheads plantarly.  This area is painful to touch.  The HAV 1st MPJ left foot is well healed with good ROM.  She has swelling second digit and palpable pain at site of second toe fusion.  Her second and third toes are mildly hammered with relative plantarflexion second and third mets.  Pain upon palpation second and third interspace.       Assessment:     Capsulitis left foot.  S/p foot surgery left foot.     Plan:     ROV Xray left foot.  Dispersion padding applied to her insole.  Prescribed medrol dose pak. RTC 2 weeks

## 2014-10-15 ENCOUNTER — Ambulatory Visit (INDEPENDENT_AMBULATORY_CARE_PROVIDER_SITE_OTHER): Payer: BLUE CROSS/BLUE SHIELD | Admitting: Podiatry

## 2014-10-15 ENCOUNTER — Encounter: Payer: Self-pay | Admitting: Podiatry

## 2014-10-15 VITALS — BP 100/67 | HR 74 | Resp 18

## 2014-10-15 DIAGNOSIS — M779 Enthesopathy, unspecified: Secondary | ICD-10-CM | POA: Diagnosis not present

## 2014-10-15 DIAGNOSIS — Z09 Encounter for follow-up examination after completed treatment for conditions other than malignant neoplasm: Secondary | ICD-10-CM

## 2014-10-15 NOTE — Progress Notes (Signed)
Subjective:     Patient ID: Darlene Lawson, female   DOB: November 05, 1960, 54 y.o.   MRN: [id number]  HPIThis patient returns with continued pain in her left forefoot.  She had surgery performed by Dr. Blenda Mounts in January.  She then started having left forefoot pain.  She was treated by Dr. Valentina Lucks for capsulitis with injection therapy.  Her pain then has returned and now she feels like her bones are coming through the bottom of her foot.  She was treated by myself with medrol dosepak and padding which provided no benefit.  She presents for evaluation and treatment.   Review of Systems     Objective:   Physical Exam GENERAL APPEARANCE: Alert, conversant. Appropriately groomed. No acute distress.  VASCULAR: Pedal pulses palpable at 2/4 DP and PT bilateral.  Capillary refill time is immediate to all digits,  Proximal to distal cooling it warm to warm.  Digital hair growth is present bilateral  NEUROLOGIC: sensation is intact epicritically and protectively to 5.07 monofilament at 5/5 sites bilateral.  Light touch is intact bilateral, vibratory sensation intact bilateral, achilles tendon reflex is intact bilateral.  MUSCULOSKELETAL: acceptable muscle strength, tone and stability bilateral.  Intrinsic muscluature intact bilateral.  Rectus appearance of foot and digits noted bilateral. Pain persists through her metatarsals left foot but severe pain is noted in fourth metatarsal head.    DERMATOLOGIC: skin color, texture, and turgor are within normal limits.  No preulcerative lesions or ulcers  are seen, no interdigital maceration noted.  No open lesions present.  Digital nails are asymptomatic. No drainage noted. Painful purplish discoloration plantar second and third metatarsals      Assessment:     S/p foot surgery.     Plan:     ROV.  At this juncture I feel she needs to be evaluated by our surgery  for possible surgery which includes metatarsal osteotomy  Fourth left.  Appt made for surgical consult.

## 2014-10-27 ENCOUNTER — Other Ambulatory Visit: Payer: Self-pay | Admitting: Podiatrist

## 2014-10-28 ENCOUNTER — Ambulatory Visit: Payer: BLUE CROSS/BLUE SHIELD | Admitting: Podiatry

## 2014-10-31 ENCOUNTER — Encounter: Payer: Self-pay | Admitting: Podiatry

## 2014-10-31 ENCOUNTER — Ambulatory Visit (INDEPENDENT_AMBULATORY_CARE_PROVIDER_SITE_OTHER): Payer: BLUE CROSS/BLUE SHIELD | Admitting: Podiatry

## 2014-10-31 VITALS — BP 123/77 | HR 72 | Resp 12

## 2014-10-31 DIAGNOSIS — M7742 Metatarsalgia, left foot: Secondary | ICD-10-CM

## 2014-10-31 DIAGNOSIS — M79672 Pain in left foot: Secondary | ICD-10-CM

## 2014-10-31 DIAGNOSIS — G5762 Lesion of plantar nerve, left lower limb: Secondary | ICD-10-CM | POA: Diagnosis not present

## 2014-10-31 DIAGNOSIS — L600 Ingrowing nail: Secondary | ICD-10-CM | POA: Diagnosis not present

## 2014-10-31 NOTE — Patient Instructions (Signed)

## 2014-11-04 ENCOUNTER — Telehealth: Payer: Self-pay | Admitting: *Deleted

## 2014-11-04 DIAGNOSIS — D3610 Benign neoplasm of peripheral nerves and autonomic nervous system, unspecified: Secondary | ICD-10-CM

## 2014-11-04 NOTE — Telephone Encounter (Addendum)
-----   Message from Trula Slade, DPM sent at 11/04/2014  7:48 AM EDT ----- Regarding: MRI Can you order an MRI of the left foot to look for a neuroma/stump neuroma. She has pain and ecchymosis to the 2nd interspace plantar. She underwent previous neurectomy with Blenda Mounts last year. Thanks. I will order and fax to [phone].  [employer] prior authorization # [id number], valid 60 days.

## 2014-11-04 NOTE — Progress Notes (Signed)
Patient ID: Darlene Lawson, female   DOB: 25-Feb-1961, 54 y.o.   MRN: [id number]  Subjective: 54 year old prima presents the office today for surgical consultation for left foot. She states that she has pain to her left foot which been ongoing for several months has been progressive. She previously has had surgery to the foot which will include bunionectomy, hammertoe repair as well as neurectomy. She states that she continues have pain particularly to the ball of foot. It was her pain is with weightbearing and pressure to the area. She denies any history of injury or trauma. She has tried shoe changes without much relief. She denies any swelling or redness. The pain does not wake her up at night.  Also states that when she states that her right second toe has become significant ingrown and painful. She has noticed some swelling and localized redness along the toenail. Denies any drainage or purulence. Denies any red streaks.  No other complaints at this time.  Objective: AAO 3, NAD DP/PT pulses are palpable, CRT less than 3 seconds Protective sensation appears to be intact with Derrel Nip monofilament, Achilles tendon reflex intact. There is diffuse tenderness of the ball the foot on the left foot. There is most of her tenderness appears to be along the second interspace. There is some ecchymosis on the plantar aspect the foot along the second interspace. There is no palpable neuroma identified. There is a hyperkeratotic lesion submetatarsal 4. Upon debridement no underlying ulceration, drainage or other clinical signs of infection. There is mild edema overlying the forefoot. There is no specific area pinpoint bony tenderness or pain the vibratory sensation. Scars from prior surgery are well-healed. Right medial second toe is ingrown with tenderness to palpation along the area. There is localized edema to the area. There is no significant erythema, ascending cellulitis, fluctuance, crepitus, malodor,  drainage/purulence. No other areas of tenderness the other toenails. No other areas of tenderness to bilateral lower extremities.  No pain with calf compression, swelling, warmth, erythema.  No open lesions or pre-ulcerative lesions.  Assessment: 54 year old female with continued left forefoot pain, second interspace pain; right medial 2nd toenail ingrown toenail.   Plan: -Previous x-rays were reviewed and discussed the patient. -Treatment options discussed including all alternatives, risks, and complications. Discussed both conservative and surgical treatment options. -At this time I do not believe the fourth metatarsal osteotomy as wanted. On x-ray she has a good parabola noted to mess with this and it would likely cause other problems. She does have ecchymosis of the plantar foot on second interspace and there is tenderness in this area. I'm concerned that she may start to have a stump neuroma present. Due to the reocrruance of pain to the foot will obtain an MRI to evaluate for a neuroma or stump neuroma. I would like an MRI due to recent surgery. I do not believe an ultrasound will give a complete picture.  -Dispensed metatarsal off when pads. -At this time, the patient is requesting partial nail removal with chemical matricectomy to the symptomatic portion of the nail. Risks and complications were discussed with the patient for which they understand and  verbally consent to the procedure. Under sterile conditions a total of 3 mL of a mixture of 2% lidocaine plain and 0.5% Marcaine plain was infiltrated in a hallux block fashion. Once anesthetized, the skin was prepped in sterile fashion. A tourniquet was then applied. Next the right medial border of the 2nd digit nail border was then sharply excised making  sure to remove the entire offending nail border. Once the nails were ensured to be removed area was debrided and the underlying skin was intact. There is no purulence identified in the  procedure. Next phenol was then applied under standard conditions and copiously irrigated. Silvadene was applied. A dry sterile dressing was applied. After application of the dressing the tourniquet was removed and there is found to be an immediate capillary refill time to the digit. The patient tolerated the procedure well any complications. Post procedure instructions were discussed the patient for which he verbally understood. Follow-up in one week for nail check or sooner if any problems are to arise. Discussed signs/symptoms of infection and directed to call the office immediately should any occur or go directly to the emergency room. In the meantime, encouraged to call the office with any questions, concerns, changes symptoms. -Follow-up 1 week for nail check or sooner if any problems arise. In the meantime, encouraged to call the office with any questions, concerns, change in symptoms.   Celesta Gentile, DPM

## 2014-11-07 ENCOUNTER — Encounter: Payer: Self-pay | Admitting: Podiatry

## 2014-11-07 ENCOUNTER — Ambulatory Visit
Admission: RE | Admit: 2014-11-07 | Discharge: 2014-11-07 | Disposition: A | Discharge status: Home / Self Care | Payer: BLUE CROSS/BLUE SHIELD | Source: Ambulatory Visit | Attending: Podiatry | Admitting: Podiatry

## 2014-11-07 ENCOUNTER — Ambulatory Visit (INDEPENDENT_AMBULATORY_CARE_PROVIDER_SITE_OTHER): Payer: BLUE CROSS/BLUE SHIELD | Admitting: Podiatry

## 2014-11-07 VITALS — BP 126/83 | HR 101

## 2014-11-07 DIAGNOSIS — Z9889 Other specified postprocedural states: Secondary | ICD-10-CM

## 2014-11-07 DIAGNOSIS — L600 Ingrowing nail: Secondary | ICD-10-CM

## 2014-11-07 DIAGNOSIS — D3610 Benign neoplasm of peripheral nerves and autonomic nervous system, unspecified: Secondary | ICD-10-CM

## 2014-11-08 ENCOUNTER — Inpatient Hospital Stay: Admission: RE | Admit: 2014-11-08 | Payer: Self-pay | Source: Ambulatory Visit

## 2014-11-13 NOTE — Progress Notes (Signed)
Patient ID: Darlene Lawson, female   DOB: 1960/08/20, 54 y.o.   MRN: [id number]  Subjective: 54 year old female presents the office today one-week status post right second toe partial nail avulsion. She states that since last appointment she is doing much better. Denies any pain to the area and denies any surrounding redness, drainage, purulence, drainage. She is continue soaking in Epson salt soaks cover with antibiotic ointment and a Band-Aid. She has a states that the pads the left foot have been miraculous and her pain is much improved without. She is scheduled for an MRI this weekend. No other complaints at this time. No acute changes. Denies any systemic complaints as fevers, chills, nausea, vomiting.  Objective: AAO 3, NAD Neurovascular status intact Status post partial nail avulsion right second toe. The procedure site is healing well and is a small amount of granulation tissue/scab over on the procedure site. There is no tenderness palpation. There is no surrounding edema, erythema, drainage, increase in warmth. No ascending cellulitis.  There is currently no areas of tenderness on the left forefoot. She states the pads of helped greatly. No other open lesions or pre-ulcerative lesions. No pain with calf compression, swelling, warmth, erythema.  Assessment: 1 week status post right partial second toenail avulsion.  Plan: Continue soaking in epsom salts twice a day followed by antibiotic ointment and a band-aid. Can leave uncovered at night. Continue this until completely healed.  If the area has not healed in 2 weeks, call the office for follow-up appointment, or sooner if any problems arise.  Scheduled for MRI this weekend of the left foot.  Monitor for any signs/symptoms of infection. Call the office immediately if any occur or go directly to the emergency room. Call with any questions/concerns.  Celesta Gentile, DPM

## 2014-11-24 ENCOUNTER — Ambulatory Visit (INDEPENDENT_AMBULATORY_CARE_PROVIDER_SITE_OTHER): Payer: BLUE CROSS/BLUE SHIELD | Admitting: Podiatry

## 2014-11-24 ENCOUNTER — Encounter: Payer: Self-pay | Admitting: Podiatry

## 2014-11-24 VITALS — BP 140/93 | HR 105 | Resp 18

## 2014-11-24 DIAGNOSIS — M7742 Metatarsalgia, left foot: Secondary | ICD-10-CM | POA: Diagnosis not present

## 2014-11-24 DIAGNOSIS — M216X2 Other acquired deformities of left foot: Secondary | ICD-10-CM

## 2014-11-24 MED ORDER — CELECOXIB 200 MG PO CAPS: 200.0000 mg | ORAL_CAPSULE | Freq: Every day | ORAL | Status: DC

## 2014-11-24 NOTE — Progress Notes (Signed)
Patient ID: Darlene Lawson, female   DOB: 29-Oct-1960, 54 y.o.   MRN: [id number]  Subjective: 54 year old female presents the office today to discuss MRI results and for follow-up evaluation left foot pain. She states of the metatarsal pads helped significantly and she would like to have more. She states that when she wears patchy does not have any pain. However she states that she is on the pain she continues to have pain overlying the left foot in the ball the foot on the third metatarsal head. She also can easily callus overlying this area. She states that her right foot second toe is doing well and is healed without any problems there is no pain, drainage, redness. No other complaints at this time.  Objective: AAO 3, NAD Neurovascular status intact There is tenderness palpation left foot submetatarsal 3. There does appear to be a plantarflexed third metatarsal. There is a small amount of hyperkeratotic tissue overlying this area as well. There is no surrounding erythema, drainage or signs of infection. There is no other areas of tenderness to palpation overlying bilateral lower extremities. The right second digit appears to be healed at this time. This is a scab overlying the area. There is no surrounding erythema, ascending cellulitis, drainage or purulence. No clinical signs of infection. No open lesions or pre-ulcerative lesions. No pain with calf compression, swelling, warmth, erythema.  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 I discussed both conservative and surgical treatment options. She were to have surgery to however she states is likely have to wait next year. For now continue with offloading pads. I discussed with her new orthotics however she wishes to hold off.I discussed  with her likely third metatarsal osteotomy if she desires surgery. -Continue to monitor for any recurrence of the right second toe. -Follow-up as needed. Call any questions or concerns or any change in symptoms in the meantime.  Celesta Gentile, DPM

## 2015-03-06 ENCOUNTER — Ambulatory Visit (INDEPENDENT_AMBULATORY_CARE_PROVIDER_SITE_OTHER): Payer: BLUE CROSS/BLUE SHIELD | Admitting: Podiatry

## 2015-03-06 ENCOUNTER — Encounter: Payer: Self-pay | Admitting: Podiatry

## 2015-03-06 VITALS — BP 125/77 | HR 77 | Resp 18

## 2015-03-06 DIAGNOSIS — L600 Ingrowing nail: Secondary | ICD-10-CM

## 2015-03-06 NOTE — Patient Instructions (Signed)

## 2015-03-12 NOTE — Progress Notes (Signed)
Patient ID: Darlene Lawson, female   DOB: [birth year], 54 y.o.   MRN: [id number]  Subjective: 54 year-old female presents the also concerns of an ingrown toenail left big toe on the lateral nail border. She states that she had a pedicure done to help remove ingrown toenail however this was unsuccessful. She's continued have pain on the nail border she's had some swelling but denies any redness or drainage. No other complaints.  Objective: AAO 3, NAD DP/PT pulses 2/4, CRT less than 3 seconds Protective sensation intact with Simms once the monofilament On the lateral aspect of the left hallux toenail is evidence of aggravation with tenderness on patient on nail border. There is no significant erythema, ascending cellulitis, drainage or pus. There is localized edema. No other areas of tenderness to bilateral lower extremities. No open lesions  Identified this time. There is a hyperkeratotic lesion left foot submetatarsal 3. Upon debridement no underlying ulceration, drainage or other signs of infection. There is no pain with calf compression, swelling, warmth, erythema.  Assessment:  54 year-old female left hallux ingrown toenail  Plan: -Treatment options discussed including all alternatives, risks, and complications -Etiology of symptoms were discussed -At this time, the patient is requesting partial nail removal with chemical matricectomy to the symptomatic portion of the nail. Risks and complications were discussed with the patient for which they understand and  verbally consent to the procedure. Under sterile conditions a total of 3 mL of a mixture of 2% lidocaine plain and 0.5% Marcaine plain was infiltrated in a hallux block fashion. Once anesthetized, the skin was prepped in sterile fashion. A tourniquet was then applied. Next the lateral aspect of hallux nail border was then sharply excised making sure to remove the entire offending nail border. Once the nails were ensured to be removed area was  debrided and the underlying skin was intact. There is no purulence identified in the procedure. Next phenol was then applied under standard conditions and copiously irrigated. Silvadene was applied. A dry sterile dressing was applied. After application of the dressing the tourniquet was removed and there is found to be an immediate capillary refill time to the digit. The patient tolerated the procedure well any complications. Post procedure instructions were discussed the patient for which he verbally understood. Follow-up in one week for nail check or sooner if any problems are to arise. Discussed signs/symptoms of infection and directed to call the office immediately should any occur or go directly to the emergency room. In the meantime, encouraged to call the office with any questions, concerns, changes symptoms. -Callus debrided without complications/bleeding.   Celesta Gentile, DPM

## 2015-03-13 ENCOUNTER — Ambulatory Visit (INDEPENDENT_AMBULATORY_CARE_PROVIDER_SITE_OTHER): Payer: BLUE CROSS/BLUE SHIELD | Admitting: Podiatry

## 2015-03-13 ENCOUNTER — Encounter: Payer: Self-pay | Admitting: Podiatry

## 2015-03-13 DIAGNOSIS — L600 Ingrowing nail: Secondary | ICD-10-CM

## 2015-03-13 DIAGNOSIS — Z9889 Other specified postprocedural states: Secondary | ICD-10-CM

## 2015-03-13 NOTE — Patient Instructions (Signed)

## 2015-03-15 DIAGNOSIS — L600 Ingrowing nail: Secondary | ICD-10-CM | POA: Insufficient documentation

## 2015-03-15 HISTORY — DX: Ingrowing nail: L60.0

## 2015-03-15 NOTE — Progress Notes (Signed)
Patient ID: Darlene Lawson, female   DOB: [birth year], 54 y.o.   MRN: [id number]  Subjective: Darlene Lawson is a 54 y.o.  female returns to office today for follow up evaluation after having left Hallux medial and lateral permanent nail avulsion performed. Patient has been soaking using epsom and applying topical antibiotic covered with bandaid daily. Denies any surrounding redness or drainage or any pain. Patient denies fevers, chills, nausea, vomiting. Denies any calf pain, chest pain, SOB.   Objective:  Vitals: Reviewed  General: Well developed, nourished, in no acute distress, alert and oriented x3   Dermatology: Skin is warm, dry and supple bilateral. Medial and lateral hallux nail border appears to be clean, dry, with mild granular tissue and surrounding scab. There is no surrounding erythema, edema, drainage/purulence. The remaining nails appear unremarkable at this time. There are no other lesions or other signs of infection present.  Neurovascular status: Intact. No lower extremity swelling; No pain with calf compression bilateral.  Musculoskeletal: No tenderness to palpation of the medial and lateral hallux nail folds. Muscular strength within normal limits bilateral.   Assesement and Plan: S/p partial nail avulsion, doing well.   -Continue soaking in epsom salts twice a day followed by antibiotic ointment and a band-aid. Can leave uncovered at night. Continue this until completely healed.  -If the area has not healed in 2 weeks, call the office for follow-up appointment, or sooner if any problems arise.  -Monitor for any signs/symptoms of infection. Call the office immediately if any occur or go directly to the emergency room. Call with any questions/concerns.  Celesta Gentile, DPM  *of note on the last progress note, only commented on that it was the lateral border that the procedure occurred on but it was both the medial and lateral borders of the left hallux toenail.

## 2015-04-25 ENCOUNTER — Other Ambulatory Visit: Payer: Self-pay | Admitting: Podiatry

## 2015-04-27 NOTE — Telephone Encounter (Signed)
Pt needs an appt if continuing to have problems. 

## 2015-05-22 ENCOUNTER — Ambulatory Visit (INDEPENDENT_AMBULATORY_CARE_PROVIDER_SITE_OTHER): Payer: BLUE CROSS/BLUE SHIELD | Admitting: Podiatry

## 2015-05-22 ENCOUNTER — Encounter: Payer: Self-pay | Admitting: Podiatry

## 2015-05-22 VITALS — BP 127/85 | HR 85 | Resp 84

## 2015-05-22 DIAGNOSIS — M79672 Pain in left foot: Secondary | ICD-10-CM

## 2015-05-22 DIAGNOSIS — M2042 Other hammer toe(s) (acquired), left foot: Secondary | ICD-10-CM

## 2015-05-22 DIAGNOSIS — M216X2 Other acquired deformities of left foot: Secondary | ICD-10-CM

## 2015-05-22 NOTE — Patient Instructions (Signed)

## 2015-05-25 DIAGNOSIS — L600 Ingrowing nail: Secondary | ICD-10-CM

## 2015-05-27 ENCOUNTER — Encounter: Payer: Self-pay | Admitting: Podiatry

## 2015-05-27 ENCOUNTER — Other Ambulatory Visit: Payer: Self-pay | Admitting: *Deleted

## 2015-05-27 DIAGNOSIS — M7742 Metatarsalgia, left foot: Secondary | ICD-10-CM | POA: Diagnosis not present

## 2015-05-27 DIAGNOSIS — M216X2 Other acquired deformities of left foot: Secondary | ICD-10-CM

## 2015-05-27 DIAGNOSIS — M2042 Other hammer toe(s) (acquired), left foot: Secondary | ICD-10-CM | POA: Diagnosis not present

## 2015-05-28 NOTE — Progress Notes (Signed)
Patient ID: Darlene Lawson, female   DOB: 27-Apr-1960, 55 y.o.   MRN: [id number]  Subjective: 55 year old female presents the office today to discuss surgical intervention to her left foot. She states that she continues to get a painful callus the bottom of the foot submetatarsal 3 and she has pain on a daily basis when walking to the third toe. She also stands a lot at work which I rates her symptoms. She has tried multiple conservative treatments including shoe gear modifications, padding, offloading without any relief of symptoms. This time should discuss surgical intervention. Denies any systemic complaints such as fevers, chills, nausea, vomiting. No acute changes since last appointment, and no other complaints at this time.   Objective: AAO x3, NAD DP/PT pulses palpable bilaterally, CRT less than 3 seconds Protective sensation intact with Simms Weinstein monofilament There is hammertoe contracture present lesser digits most notably the third toe. There is palms of the third metatarsal head and atrophy of the fat pad. There is a hyperkeratotic lesion over this area. Upon debridement no underlying ulceration, drainage or other signs of infection. Scars present overlying the toes from prior surgery. His been a previous hammertoe repair of the lesser digits. No other areas of pinpoint bony tenderness or pain with vibratory sensation. MMT 5/5, ROM WNL. No edema, erythema, increase in warmth to bilateral lower extremities.  No open lesions or pre-ulcerative lesions.  No pain with calf compression, swelling, warmth, erythema  Assessment: Left foot submetatarsal 3 hyperkeratotic lesion, prominent metatarsal head with hammertoe.  Plan: -All treatment options discussed with the patient including all alternatives, risks, complications.  -X-rays and MRI results were discussed the patient. I discussed both conservative and surgical treatment options. -At this time a discussed third metatarsal osteotomy  with revisional hammertoe repair. She would like to proceed with surgery. -The incision placement as well as the postoperative course was discussed with the patient. I discussed risks of the surgery which include, but not limited to, infection, bleeding, pain, swelling, need for further surgery, delayed or nonhealing, painful or ugly scar, numbness or sensation changes, over/under correction, recurrence, transfer lesions, further deformity, hardware failure, DVT/PE, loss of toe/foot. Patient understands these risks and wishes to proceed with surgery. The surgical consent was reviewed with the patient all 3 pages were signed. No promises or guarantees were given to the outcome of the procedure. All questions were answered to the best of my ability. Before the surgery the patient was encouraged to call the office if there is any further questions. The surgery will be performed at the [facility] on an outpatient basis. -Patient encouraged to call the office with any questions, concerns, change in symptoms.   Celesta Gentile, DPM

## 2015-06-03 ENCOUNTER — Encounter: Payer: Self-pay | Admitting: Podiatry

## 2015-06-03 ENCOUNTER — Ambulatory Visit (INDEPENDENT_AMBULATORY_CARE_PROVIDER_SITE_OTHER): Payer: BLUE CROSS/BLUE SHIELD | Admitting: Podiatry

## 2015-06-03 ENCOUNTER — Ambulatory Visit (HOSPITAL_BASED_OUTPATIENT_CLINIC_OR_DEPARTMENT_OTHER)
Admission: RE | Admit: 2015-06-03 | Discharge: 2015-06-03 | Disposition: A | Discharge status: Home / Self Care | Payer: BLUE CROSS/BLUE SHIELD | Source: Ambulatory Visit | Attending: Podiatry | Admitting: Podiatry

## 2015-06-03 DIAGNOSIS — M79672 Pain in left foot: Secondary | ICD-10-CM

## 2015-06-03 DIAGNOSIS — M2042 Other hammer toe(s) (acquired), left foot: Secondary | ICD-10-CM

## 2015-06-03 DIAGNOSIS — Z9889 Other specified postprocedural states: Secondary | ICD-10-CM | POA: Insufficient documentation

## 2015-06-03 DIAGNOSIS — M216X2 Other acquired deformities of left foot: Secondary | ICD-10-CM | POA: Diagnosis not present

## 2015-06-03 NOTE — Progress Notes (Signed)
Patient ID: Ophia Kuehnle, female   DOB: March 08, 1961, 55 y.o.   MRN: [id number]  Subjective: Honore Pense is a 55 y.o. is seen today in office s/p left 3rd metatarsal osteotomy and hammertoe repair preformed on 05/27/15.  She states that she's been having some pain and she has increased the dose of Percocet which seems to help. She is also tried remaining nonweightbearing as possible she's effusion the scooter. Denies any systemic complaints such as fevers, chills, nausea, vomiting. No calf pain, chest pain, shortness of breath.   Objective: General: No acute distress, AAOx3  DP/PT pulses palpable 2/4, CRT < 3 sec to all digits.  Protective sensation intact. Motor function intact.  Left foot: Incision is well coapted without any evidence of dehiscence. K wire is intact. There is no surrounding erythema, ascending cellulitis, fluctuance, crepitus, malodor, drainage/purulence. There is mild edema around the surgical site. There is moderate pain along the surgical site.  To the distal aspect the left third toe there does appear to be an annular area of black tissue wishes appear to be superficial and not full-thickness. Around this area there is a good capillary refill time in this area does appear to be localized to the tip of the toe just inferior to the K wire. There is no drainage or pus or any fluctuance or crepitus. No other areas of tenderness to bilateral lower extremities.  No other open lesions or pre-ulcerative lesions.  No pain with calf compression, swelling, warmth, erythema.   Assessment and Plan:  Status post left 3rd metatarsal osteotomy and toe repair; there is superficial appearing necrosis to the distal plantar aspect of the toe  -Treatment options discussed including all alternatives, risks, and complications -Ordered x-rays. See x-ray report below. -Antibiotic ointment was placed over the incision followed by dry sterile dressing. -I had a long discussion with the patient in  regards to the area of tissue necrosis. This appears to be a localized area and there is a good capillary refill time the remainder the toe and around this area of concern. We'll continue to monitor this area very closely. I will see her back on Friday to monitor. I did loosen the k-wire today.  I also discussed this with another physician within the practice.  -Pain medication as needed.  -Hold off on icing the foot -Monitor for any clinical signs or symptoms of infection and DVT/PE and directed to call the office immediately should any occur or go to the ER. -Follow-up on Friday in [place] or sooner if any problems arise. In the meantime, encouraged to call the office with any questions, concerns, change in symptoms.   IMPRESSION: 1. Retrograde K wire fixation through the phalanges and into the proximal shaft of the third metacarpal, with expected alignment. There are 2 small screws in the vicinity of the head of the third metacarpal. 2. Similar appearance of flattening and bony volume loss along the heads of the second and third proximal phalanges. 3. Prior osteotomy and k-wire fixation of the first metatarsal head.  * Upon my evaluation of the x-rays the distal screw does appear to be within the MPJ. However clinically during the time of surgery the Cipro to be plantar to the joint there is no restriction with MTPJ range of motion and the screw is not appear to be in the joint.  Celesta Gentile, DPM

## 2015-06-05 ENCOUNTER — Ambulatory Visit (INDEPENDENT_AMBULATORY_CARE_PROVIDER_SITE_OTHER): Payer: BLUE CROSS/BLUE SHIELD | Admitting: Podiatry

## 2015-06-05 ENCOUNTER — Ambulatory Visit: Payer: BLUE CROSS/BLUE SHIELD

## 2015-06-05 DIAGNOSIS — M216X2 Other acquired deformities of left foot: Secondary | ICD-10-CM

## 2015-06-05 DIAGNOSIS — L989 Disorder of the skin and subcutaneous tissue, unspecified: Secondary | ICD-10-CM

## 2015-06-05 DIAGNOSIS — M2042 Other hammer toe(s) (acquired), left foot: Secondary | ICD-10-CM

## 2015-06-05 DIAGNOSIS — Z09 Encounter for follow-up examination after completed treatment for conditions other than malignant neoplasm: Secondary | ICD-10-CM

## 2015-06-05 DIAGNOSIS — I96 Gangrene, not elsewhere classified: Secondary | ICD-10-CM

## 2015-06-05 MED ORDER — OXYCODONE-ACETAMINOPHEN 5-325 MG PO TABS: 1.0000 | ORAL_TABLET | Freq: Four times a day (QID) | ORAL | Status: DC | PRN

## 2015-06-08 ENCOUNTER — Other Ambulatory Visit: Payer: Self-pay | Admitting: Podiatry

## 2015-06-08 MED ORDER — DIAZEPAM 5 MG PO TABS: 5.0000 mg | ORAL_TABLET | Freq: Every evening | ORAL | Status: DC | PRN

## 2015-06-08 NOTE — Progress Notes (Signed)
Patient ID: Darlene Lawson, female   DOB: 27-Jan-1961, 55 y.o.   MRN: [id number]  Subjective: Darlene Lawson is a 55 y.o. is seen today in office s/p left 3rd metatarsal osteotomy and hammertoe repair preformed on 05/27/15.  She presents today for follow-up evaluation of discoloration to the toe. She states that she does continue pain and she is taking Percocet every 6 hours 1 pill. She is also to continue with nonweightbearing with the use of a knee scooter. Denies any systemic complaints such as fevers, chills, nausea, vomiting. No calf pain, chest pain, shortness of breath.   Objective: General: No acute distress, AAOx3  DP/PT pulses palpable 2/4, CRT < 3 sec to all digits.  Protective sensation intact. Motor function intact.  Left foot: Incision is well coapted without any evidence of dehiscence. K wire is intact. There is no surrounding erythema, ascending cellulitis, fluctuance, crepitus, malodor, drainage/purulence. There is mild edema around the surgical site. There is moderate pain along the surgical site.  To the distal aspect the left third toe there does appear to be an annular area of black tissue wishes appear to be superficial and not full-thickness. This area does appear to be somewhat smaller compared to last appointment. Around this area does appear to be more hemorrhagic-type bulla. There is a medial capillary refill time to the remainder of the toes. No other areas of tenderness to bilateral lower extremities.  No other open lesions or pre-ulcerative lesions.  No pain with calf compression, swelling, warmth, erythema.   Assessment and Plan:  Status post left 3rd metatarsal osteotomy and toe repair; there is superficial appearing necrosis to the distal plantar aspect of the toe  -Treatment options discussed including all alternatives, risks, and complications -At today's appointment although the necrosis does appear to be somewhat smaller compared to last appointment and still concerned  about worsening. Because of this I did decide to go ahead and remove the K wire today. I had a long discussion the patient that she may require revisional surgery as the wire as holding both the toe and the metatarsal. She understands this but for now my concern is viability of this toe. The K wire was removed in total without complications. X-rays were performed afterwards the metatarsal remains in the position. The toe was splinted in a rectus position. -In about equivalent is applied overlying the dressing followed by dry sterile dressing. -Continue to monitor closely. The end of the toe was left exposed to check for color. -Follow-up on Wednesday or sooner if any issues are to arise. Call any questions or concerns in the meantime.  Celesta Gentile, DPM

## 2015-06-10 ENCOUNTER — Encounter: Payer: Self-pay | Admitting: Podiatry

## 2015-06-10 ENCOUNTER — Ambulatory Visit (INDEPENDENT_AMBULATORY_CARE_PROVIDER_SITE_OTHER): Payer: BLUE CROSS/BLUE SHIELD | Admitting: Podiatry

## 2015-06-10 VITALS — BP 116/77 | HR 97 | Resp 18

## 2015-06-10 DIAGNOSIS — M2042 Other hammer toe(s) (acquired), left foot: Secondary | ICD-10-CM

## 2015-06-10 DIAGNOSIS — M216X2 Other acquired deformities of left foot: Secondary | ICD-10-CM

## 2015-06-10 DIAGNOSIS — Z09 Encounter for follow-up examination after completed treatment for conditions other than malignant neoplasm: Secondary | ICD-10-CM

## 2015-06-10 DIAGNOSIS — L989 Disorder of the skin and subcutaneous tissue, unspecified: Secondary | ICD-10-CM

## 2015-06-10 DIAGNOSIS — I96 Gangrene, not elsewhere classified: Secondary | ICD-10-CM

## 2015-06-11 MED ORDER — CLINDAMYCIN HCL 150 MG PO CAPS: 150.0000 mg | ORAL_CAPSULE | Freq: Three times a day (TID) | ORAL | Status: DC

## 2015-06-11 NOTE — Progress Notes (Signed)
Patient ID: Darlene Lawson, female   DOB: 10-22-1960, 55 y.o.   MRN: [id number]  Subjective: Darlene Lawson is a 55 y.o. is seen today in office s/p left 3rd metatarsal osteotomy and hammertoe repair preformed on 05/27/15.  She states that since last appointment her pain is greatly improved however she was having quite a bit difficulty sleeping. Prescribed Valium and she states that she did sleep about 6 hours left side anastomosis she has slept in surgery. The toe is feeling better and she is decreased amount of pain medicine she's been taking. Denies any systemic complaints such as fevers, chills, nausea, vomiting. No calf pain, chest pain, shortness of breath.   Objective: General: No acute distress, AAOx3  DP/PT pulses palpable 2/4, CRT < 3 sec to all digits.  Protective sensation intact. Motor function intact.  Left foot: Incision is well coapted without any evidence of dehiscence. There is no surrounding erythema, ascending cellulitis, fluctuance, crepitus, malodor, drainage/purulence. There is improved edema around the surgical site. There is decreased pain along the surgical site. The plantar aspect of the digit there does continue to be a circumferential area of skin discoloration. Today it appears to be more of a purple, dark-colored appears to be more blood under the skin. It also appears to be lighter in color compared to last appointment. It appears to be almost  more of a hemorrhagic type bulla at this time. There is an immediate capillary refill time to the distal tip of the toe into other areas of the digit. No other areas of tenderness to bilateral lower extremities.  No other open lesions or pre-ulcerative lesions.  No pain with calf compression, swelling, warmth, erythema.   Assessment and Plan:  Status post left 3rd metatarsal osteotomy and toe repair; there is superficial appearing necrosis/bulla to the distal plantar aspect of the toe  -Treatment options discussed including all  alternatives, risks, and complications -Antibiotic ointment was applied followed by a dressing. Keep the dressing clean, dry, intact. The distal aspect of the toe was left exposed to check the capillary refill time. -Pain medication as needed. Valium as needed -NWB -Monitor for any clinical signs or symptoms of infection and directed to call the office immediately should any occur or go to the ER. -Continue to monitor closely. The end of the toe was left exposed to check for color. -Follow-up in 1 week or sooner if any issues are to arise. Call any questions or concerns in the meantime. *suture removal next appointment  Celesta Gentile, DPM

## 2015-06-11 NOTE — Addendum Note (Signed)
Addended by: Celesta Gentile R on: 06/11/2015 12:50 PM   Modules accepted: Orders

## 2015-06-15 ENCOUNTER — Encounter: Payer: Self-pay | Admitting: Podiatry

## 2015-06-15 ENCOUNTER — Telehealth: Payer: Self-pay | Admitting: *Deleted

## 2015-06-15 ENCOUNTER — Other Ambulatory Visit: Payer: Self-pay | Admitting: Podiatry

## 2015-06-15 MED ORDER — ZOLPIDEM TARTRATE 5 MG PO TABS
5.0000 mg | ORAL_TABLET | Freq: Every evening | ORAL | Status: DC | PRN
Start: 2015-06-15 — End: 2020-08-03

## 2015-06-15 NOTE — Telephone Encounter (Signed)
Prescription given to patient for Ambien 5mg  tablet refill #30 T Hs,refilled by Dr Gardiner Barefoot 06/15/15

## 2015-06-15 NOTE — Progress Notes (Signed)
Patient states she is having difficulty sleeping and the valium is not helping; rx ambien. Stop valium. No alcohol use.

## 2015-06-17 ENCOUNTER — Ambulatory Visit (INDEPENDENT_AMBULATORY_CARE_PROVIDER_SITE_OTHER): Payer: BLUE CROSS/BLUE SHIELD | Admitting: Podiatry

## 2015-06-17 ENCOUNTER — Encounter: Payer: Self-pay | Admitting: Podiatry

## 2015-06-17 VITALS — BP 136/99 | HR 83 | Resp 18

## 2015-06-17 DIAGNOSIS — M2042 Other hammer toe(s) (acquired), left foot: Secondary | ICD-10-CM

## 2015-06-17 DIAGNOSIS — M216X2 Other acquired deformities of left foot: Secondary | ICD-10-CM

## 2015-06-17 DIAGNOSIS — L989 Disorder of the skin and subcutaneous tissue, unspecified: Secondary | ICD-10-CM

## 2015-06-17 DIAGNOSIS — I96 Gangrene, not elsewhere classified: Secondary | ICD-10-CM

## 2015-06-17 DIAGNOSIS — Z09 Encounter for follow-up examination after completed treatment for conditions other than malignant neoplasm: Secondary | ICD-10-CM

## 2015-06-17 MED ORDER — OXYCODONE-ACETAMINOPHEN 5-325 MG PO TABS: 1.0000 | ORAL_TABLET | Freq: Four times a day (QID) | ORAL | Status: DC | PRN

## 2015-06-18 DIAGNOSIS — M2040 Other hammer toe(s) (acquired), unspecified foot: Secondary | ICD-10-CM | POA: Insufficient documentation

## 2015-06-18 DIAGNOSIS — M216X9 Other acquired deformities of unspecified foot: Secondary | ICD-10-CM

## 2015-06-18 DIAGNOSIS — Z09 Encounter for follow-up examination after completed treatment for conditions other than malignant neoplasm: Secondary | ICD-10-CM | POA: Insufficient documentation

## 2015-06-18 HISTORY — DX: Other acquired deformities of unspecified foot: M21.6X9

## 2015-06-18 HISTORY — DX: Encounter for follow-up examination after completed treatment for conditions other than malignant neoplasm: Z09

## 2015-06-18 HISTORY — DX: Other hammer toe(s) (acquired), unspecified foot: M20.40

## 2015-06-18 NOTE — Progress Notes (Signed)
Patient ID: Darlene Lawson, female   DOB: 1960-07-26, 55 y.o.   MRN: [id number]  Subjective: Darlene Lawson is a 55 y.o. is seen today in office s/p left 3rd metatarsal osteotomy and hammertoe repair preformed on 05/27/15. She feels that her pain continues to improve although she she is out of pain medicine she does take this intermittently been on a daily basis. She does that this is starting the Ambien she did sleep more last night. She is not taking the Valium. She has continue nonweightbearing in CAM boot with a knee scooter. No recent injury or trauma. Denies any systemic complaints such as fevers, chills, nausea, vomiting. No calf pain, chest pain, shortness of breath.   Objective: General: No acute distress, AAOx3  DP/PT pulses palpable 2/4, CRT < 3 sec to all digits.  Protective sensation intact. Motor function intact.  Left foot: Incision is well coapted without any evidence of dehiscence. There is no surrounding erythema, ascending cellulitis, fluctuance, crepitus, malodor, drainage/purulence. There is minimal edema around the surgical site. There is decreased pain along the surgical site. The plantar aspect of the digit there does continue to be a circumferential area of skin discoloration. This appears to be more dried blood and superficial necrosis. Upon debridement around the periphery lesion was debrided and there is underlying healthy skin. Underlying skin is pink. On the center aspect is a thicker area and there is a small granular wound underneath this area. I do not debride the entire area and will debrided slowly as the area sloughs off. There is no drainage or pus. There is no erythema or edema to the toe. There is tenderness along the plantar submetatarsal 3 area however there is no significant pain of the toe to the dorsal metatarsal. No other areas of tenderness. There is mild hammertoe contractures the remaining digits. No other open lesions or pre-ulcerative lesions.  No pain with calf  compression, swelling, warmth, erythema.   Assessment and Plan:  Status post left 3rd metatarsal osteotomy and toe repair; there is superficial appearing necrosis/bulla to the distal plantar aspect of the toe  -Treatment options discussed including all alternatives, risks, and complications -The loose tissue was debrided and there is underlying healthy skin. Continue in about ointment overlying this area. Every other suture was removed today. In about ointment was applied over the incision followed by dry sterile dressing. Keep the dressing clean, dry, intact. -Continue nonweightbearing with cam boot -Aspirin daily -Monitor for any clinical signs or symptoms of infection and directed to call the office immediately should any occur or go to the ER. -Follow-up in 1 week or sooner if any issues are to arise. Call any questions or concerns in the meantime. *suture removal next appointment, x-ray next appointment.   Celesta Gentile, DPM

## 2015-06-20 ENCOUNTER — Other Ambulatory Visit: Payer: Self-pay | Admitting: Podiatry

## 2015-06-24 ENCOUNTER — Encounter: Payer: Self-pay | Admitting: Podiatry

## 2015-06-24 ENCOUNTER — Ambulatory Visit (INDEPENDENT_AMBULATORY_CARE_PROVIDER_SITE_OTHER): Payer: BLUE CROSS/BLUE SHIELD | Admitting: Podiatry

## 2015-06-24 ENCOUNTER — Ambulatory Visit (HOSPITAL_BASED_OUTPATIENT_CLINIC_OR_DEPARTMENT_OTHER)
Admission: RE | Admit: 2015-06-24 | Discharge: 2015-06-24 | Disposition: A | Discharge status: Home / Self Care | Payer: BLUE CROSS/BLUE SHIELD | Source: Ambulatory Visit | Attending: Podiatry | Admitting: Podiatry

## 2015-06-24 VITALS — BP 130/66 | HR 77 | Resp 18

## 2015-06-24 DIAGNOSIS — Z09 Encounter for follow-up examination after completed treatment for conditions other than malignant neoplasm: Secondary | ICD-10-CM

## 2015-06-24 DIAGNOSIS — S92332D Displaced fracture of third metatarsal bone, left foot, subsequent encounter for fracture with routine healing: Secondary | ICD-10-CM | POA: Insufficient documentation

## 2015-06-24 DIAGNOSIS — X58XXXD Exposure to other specified factors, subsequent encounter: Secondary | ICD-10-CM | POA: Diagnosis not present

## 2015-06-24 DIAGNOSIS — Z9889 Other specified postprocedural states: Secondary | ICD-10-CM | POA: Insufficient documentation

## 2015-06-24 DIAGNOSIS — M216X2 Other acquired deformities of left foot: Secondary | ICD-10-CM | POA: Diagnosis not present

## 2015-06-24 DIAGNOSIS — L989 Disorder of the skin and subcutaneous tissue, unspecified: Secondary | ICD-10-CM

## 2015-06-24 DIAGNOSIS — I96 Gangrene, not elsewhere classified: Secondary | ICD-10-CM

## 2015-06-24 MED ORDER — ALPRAZOLAM 0.25 MG PO TABS: 0.2500 mg | ORAL_TABLET | Freq: Every day | ORAL | Status: DC

## 2015-06-24 NOTE — Patient Instructions (Signed)
YOU CANNOT TAKE ANY VALIUM, PAIN MEDICATION AMBIEN OR ANY OTHER SIMILAR MEDICATIONS WHEN TAKING XANAX. THIS MEDICATION CAN CAUSE PROFOUND SEDATION, DECREASED BREATHING, COMA AND DEATH

## 2015-06-25 NOTE — Progress Notes (Signed)
Patient ID: Gayathri Vialpando, female   DOB: [birth year], 55 y.o.   MRN: [id number]  Subjective: Levetta Aran is a 55 y.o. is seen today in office s/p left 3rd metatarsal osteotomy and hammertoe repair preformed on 05/27/15. She states that overall she is doing better and she is not having a throbbing sensation that she was having. She also believes that the swelling has come down quite a bit. She does tell me today that she did take her sister Xanax and this did help. She states that since the surgery she has been emotional and had some anxiety and not been able to sleep well. Since she took the Xanax she was able to sleep and relax. She is asking for a prescription this today. She has continue nonweightbearing in the cam boot with the use of a rolling knee scooter. She denies any recent injury or trauma.  Denies any systemic complaints such as fevers, chills, nausea, vomiting. No calf pain, chest pain, shortness of breath.   Objective: General: No acute distress, AAOx3  DP/PT pulses palpable 2/4, CRT < 3 sec to all digits.  Protective sensation intact. Motor function intact.  Left foot: Incision is well coapted without any evidence of dehiscence and some sutures remain. There is no surrounding erythema, ascending cellulitis, fluctuance, crepitus, malodor, drainage/purulence. There is improved edema around the surgical site, and there is no significant edema at this time. Skin lines are present. There is decreased tenderness to palpation on the surgical site. MPJ range of motion is intact without any pain. At the distal aspect of the plantar right third toe does continue to be an area of skin necrosis although does appear to be smaller compared to last upon it. The area that I debrided last appointment is still nice pink skin. I debrided more of the medial tissue today and there is underlying pink skin as well. The central area does continue to have some necrosis however it is starting to dry up and callus over.  There is no drainage or pus. No edema or erythema the toe. There is no clinical signs of infection. Third toe sits in a rectus position. There is mild hammertoe contractures the remaining digits. No other open lesions or pre-ulcerative lesions.  No pain with calf compression, swelling, warmth, erythema.   Assessment and Plan:  Status post left 3rd metatarsal osteotomy and toe repair; resolving skin superficial necrosis distal third toe.  -Treatment options discussed including all alternatives, risks, and complications -The loose tissue was debrided and there is underlying healthy skin. Continue in about ointment overlying this area. Remainder the sutures removed today.  -Transition to a surgical shoe although remain nonweightbearing for now.  -Range of motion exercises for the MPJ.  -Aspirin daily -Monitor for any clinical signs or symptoms of infection and directed to call the office immediately should any occur or go to the ER. -Follow-up in 2 weeks or sooner if any issues are to arise. Call any questions or concerns in the meantime.  x-ray next appointment.   Celesta Gentile, DPM

## 2015-07-02 ENCOUNTER — Other Ambulatory Visit: Payer: Self-pay | Admitting: Sports Medicine

## 2015-07-02 MED ORDER — ALPRAZOLAM 0.25 MG PO TABS: 0.2500 mg | ORAL_TABLET | Freq: Every day | ORAL | Status: DC

## 2015-07-08 ENCOUNTER — Ambulatory Visit (HOSPITAL_BASED_OUTPATIENT_CLINIC_OR_DEPARTMENT_OTHER)
Admission: RE | Admit: 2015-07-08 | Discharge: 2015-07-08 | Disposition: A | Discharge status: Home / Self Care | Payer: BLUE CROSS/BLUE SHIELD | Source: Ambulatory Visit | Attending: Podiatry | Admitting: Podiatry

## 2015-07-08 ENCOUNTER — Encounter: Payer: Self-pay | Admitting: Podiatry

## 2015-07-08 ENCOUNTER — Ambulatory Visit (INDEPENDENT_AMBULATORY_CARE_PROVIDER_SITE_OTHER): Payer: BLUE CROSS/BLUE SHIELD | Admitting: Podiatry

## 2015-07-08 VITALS — BP 112/80 | HR 88 | Resp 18

## 2015-07-08 DIAGNOSIS — Z9889 Other specified postprocedural states: Secondary | ICD-10-CM | POA: Insufficient documentation

## 2015-07-08 DIAGNOSIS — I96 Gangrene, not elsewhere classified: Secondary | ICD-10-CM

## 2015-07-08 DIAGNOSIS — M216X2 Other acquired deformities of left foot: Secondary | ICD-10-CM

## 2015-07-08 DIAGNOSIS — M2042 Other hammer toe(s) (acquired), left foot: Secondary | ICD-10-CM

## 2015-07-08 DIAGNOSIS — L989 Disorder of the skin and subcutaneous tissue, unspecified: Secondary | ICD-10-CM

## 2015-07-08 DIAGNOSIS — Z09 Encounter for follow-up examination after completed treatment for conditions other than malignant neoplasm: Secondary | ICD-10-CM

## 2015-07-08 MED ORDER — ALPRAZOLAM 1 MG PO TABS: 1.0000 mg | ORAL_TABLET | Freq: Every evening | ORAL | Status: DC | PRN

## 2015-07-10 NOTE — Progress Notes (Signed)
Patient ID: Darlene Lawson, female   DOB: 04-24-1960, 55 y.o.   MRN: [id number]  Subjective: Darlene Lawson is a 55 y.o. is seen today in office s/p left 3rd metatarsal osteotomy and hammertoe repair preformed on 05/27/15. She states that she has been feeling better and she has been sleeping the Xanax has been helping her relax. She's had some anxiety since the surgery. She is states that there was a piece of black skin which fell off on the bottom of the toe. Her pain has improved.  She denies any recent injury or trauma.  Denies any systemic complaints such as fevers, chills, nausea, vomiting. No calf pain, chest pain, shortness of breath.   Objective: General: No acute distress, AAOx3  DP/PT pulses palpable 2/4, CRT < 3 sec to all digits.  Protective sensation intact. Motor function intact.  Left foot: Incision is well coapted without any evidence of dehiscence and a scar has formed. There is no surrounding erythema, ascending cellulitis, fluctuance, crepitus, malodor, drainage/purulence. There is improved edema around the surgical site, and there is no significant edema at this time. Skin lines are present. There is decreased tenderness to palpation on the surgical site. MPJ range of motion is intact without any pain. There is mild discomfort along the metatarsals surgical site. At the distal aspect of the plantar right third toe does continue to be an area of skin necrosis although does appear to be smaller compared to last upon it. The area that I debrided last appointment is still nice pink skin. The area of skin necrosis appears to be starting to lift up. There is no drainage or pus. No surrounding erythema, ascending cellulitis. No drainage or pus. The toe does sit in a rectus position. There is mild hammertoe contractures the remaining digits. No other open lesions or pre-ulcerative lesions.  No pain with calf compression, swelling, warmth, erythema.   Assessment and Plan:  Status post left 3rd  metatarsal osteotomy and toe repair; resolving skin superficial necrosis distal third toe.  -Treatment options discussed including all alternatives, risks, and complications -The loose tissue was debrided and there is underlying healthy skin. On the small piece of skin necrosis remains on the plantar aspect of the toe however the majority of it has been removed. Recommend antibiotic ointment to the area daily.  -She did start to weight-bear as tolerated in cam boot. -Refilled xanax as this is helping her quite a bit. She told me she is not taking any pain medication. Discussed with her that if this is a long term medications she will need to see her PCP.  -Monitor for any clinical signs or symptoms of infection and directed to call the office immediately should any occur or go to the ER. -Follow-up in 2 weeks or sooner if any issues are to arise. Call any questions or concerns in the meantime.   Celesta Gentile, DPM

## 2015-07-27 NOTE — Progress Notes (Signed)
DOS Left revisional 3rd hammer toe repair with wire fixation, 3rd metatarsal cutting and repositioning with screw fixation.

## 2015-07-29 ENCOUNTER — Ambulatory Visit (INDEPENDENT_AMBULATORY_CARE_PROVIDER_SITE_OTHER): Payer: BLUE CROSS/BLUE SHIELD | Admitting: Podiatry

## 2015-07-29 ENCOUNTER — Ambulatory Visit (HOSPITAL_BASED_OUTPATIENT_CLINIC_OR_DEPARTMENT_OTHER)
Admission: RE | Admit: 2015-07-29 | Discharge: 2015-07-29 | Disposition: A | Discharge status: Home / Self Care | Payer: BLUE CROSS/BLUE SHIELD | Source: Ambulatory Visit | Attending: Podiatry | Admitting: Podiatry

## 2015-07-29 DIAGNOSIS — Z9889 Other specified postprocedural states: Secondary | ICD-10-CM | POA: Insufficient documentation

## 2015-07-29 DIAGNOSIS — M79676 Pain in unspecified toe(s): Secondary | ICD-10-CM | POA: Diagnosis not present

## 2015-07-29 DIAGNOSIS — M216X2 Other acquired deformities of left foot: Secondary | ICD-10-CM

## 2015-07-29 DIAGNOSIS — B351 Tinea unguium: Secondary | ICD-10-CM | POA: Diagnosis not present

## 2015-07-29 DIAGNOSIS — Z09 Encounter for follow-up examination after completed treatment for conditions other than malignant neoplasm: Secondary | ICD-10-CM

## 2015-07-30 NOTE — Progress Notes (Signed)
Patient ID: Darlene Lawson, female   DOB: Aug 05, 1960, 55 y.o.   MRN: [id number]  Subjective: Darlene Lawson is a 55 y.o. is seen today in office s/p left 3rd metatarsal osteotomy and hammertoe repair preformed on 05/27/15. She states that overall she is doing much better. She is able to angle and a surgical shoe for any pain. She has noticed a small spot on the bottom of the base of her third toe which causes some occasional discomfort. Denies any drainage from the incision the incision remains closed. The color to the toes looking much better. She denies any recent injury or trauma.  She is also requesting her nails be trimmed as they are thickened and elongated and she cannot trim them herself. Denies any systemic complaints such as fevers, chills, nausea, vomiting. No calf pain, chest pain, shortness of breath.   Objective: General: No acute distress, AAOx3  DP/PT pulses palpable 2/4, CRT < 3 sec to all digits.  Protective sensation intact. Motor function intact.  Left foot: Incision is well coapted without any evidence of dehiscence and a scar has formed. No surrounding erythema or increase in warmth. There is minimal edema to the surgical site. The tip of the toe there is a residual area of skin necrosis which was debrided today there is underlying healthy skin present. There is no further area of scab or skin necrosis present. At the base of the plantar aspect of the third toes a small cystlike structure. On debridement there is no drainage or pus. This is not appear to be from the screw as this is a mobile mass. No overlying edema, erythema, increase in warmth. I didn't try to debride the area today there is no drainage. The toe sits in rectus position. There is no pain with MPJ range of motion there is no restriction. Nails are hypertrophic, dystrophic, discolored, elongated 10. No surrounding redness or drainage. Tenderness to nails 1-5 bilaterally. There is mild hammertoe contractures the remaining  digits. No other open lesions or pre-ulcerative lesions.  No pain with calf compression, swelling, warmth, erythema.   Assessment and Plan:  Status post left 3rd metatarsal osteotomy and toe repair; resolved skin necrosis; symptomatic onychomycosis   -Treatment options discussed including all alternatives, risks, and complications -The remaining aspects of the skin necrosis was debrided today without complications. At this time the toe is pink and collared appears to be healthy. There is minimal edema. She can start to transition to a regular shoe as tolerated. The area of the cyst on the plantar aspect of the toe was slightly debrided to remove the small amount of hyperkeratotic tissue overlying the area however there is no drainage or pus. This is not appear to be from the hardware that is prominent as per Darlene Lawson of systems it is mobile and soft. We'll continue to monitor closely. When she goes back into his shoe and recommended to wear the metatarsal pad as she does have prominence of all the metatarsal heads and there is atrophy of the fat pad. -Nails debrided 10 without complications or bleeding. -Monitor for any clinical signs or symptoms of infection and directed to call the office immediately should any occur or go to the ER. -Follow-up in 2 weeks or sooner if any issues are to arise. Call any questions or concerns in the meantime. Hopefully at that time we she return to work.  Celesta Gentile, DPM

## 2015-08-12 ENCOUNTER — Ambulatory Visit (INDEPENDENT_AMBULATORY_CARE_PROVIDER_SITE_OTHER): Payer: BLUE CROSS/BLUE SHIELD | Admitting: Podiatry

## 2015-08-12 ENCOUNTER — Ambulatory Visit (HOSPITAL_BASED_OUTPATIENT_CLINIC_OR_DEPARTMENT_OTHER)
Admission: RE | Admit: 2015-08-12 | Discharge: 2015-08-12 | Disposition: A | Discharge status: Home / Self Care | Payer: BLUE CROSS/BLUE SHIELD | Source: Ambulatory Visit | Attending: Podiatry | Admitting: Podiatry

## 2015-08-12 ENCOUNTER — Encounter: Payer: Self-pay | Admitting: Podiatry

## 2015-08-12 DIAGNOSIS — Z967 Presence of other bone and tendon implants: Secondary | ICD-10-CM

## 2015-08-12 DIAGNOSIS — M21619 Bunion of unspecified foot: Secondary | ICD-10-CM

## 2015-08-12 DIAGNOSIS — M216X2 Other acquired deformities of left foot: Secondary | ICD-10-CM

## 2015-08-12 DIAGNOSIS — Z9889 Other specified postprocedural states: Secondary | ICD-10-CM | POA: Insufficient documentation

## 2015-08-13 NOTE — Progress Notes (Signed)
Patient ID: Darlene Lawson, female   DOB: 26-Apr-1960, 55 y.o.   MRN: [id number]  Subjective: Darlene Lawson is a 55 y.o. is seen today in office s/p left 3rd metatarsal osteotomy and hammertoe repair preformed on 05/27/15. She says that she is remaining surgical shoe. She is trying to buy to regular shoe however she says when she walks she feels that is poking on the bottom of the foot. She also states that she has been some pain for which she points the bunion site. No recent injury or trauma. The swelling has improved. Continues icing.Denies any systemic complaints such as fevers, chills, nausea, vomiting. No calf pain, chest pain, shortness of breath.   Objective: General: No acute distress, AAOx3  DP/PT pulses palpable 2/4, CRT < 3 sec to all digits.  Protective sensation intact. Motor function intact.  Left foot: Incision is well coapted without any evidence of dehiscence and a scar has formed. No surrounding erythema or increase in warmth. There is minimal edema to the surgical site. There is skin necrosis has completely resolved. Scars are well-healed. No overlying erythema or increase in warmth. Just distal to the metatarsal head is an area of tenderness. I'm unable to palpate the screw however this is very tender for her. This does point the direction of the screw is overlying the area of what appears to be the cyst. I'm concerned that this is prominence of the hardware. There is also some mild discomfort along the bunion on the left foot as well however there is no edema, erythema. She describes as a sharp pain overlying the bunion site. There is also prominent metatarsal heads plantarly. There is no tenderness metatarsal heads himself. There is mild hammertoe contractures the remaining digits. No other open lesions or pre-ulcerative lesions.  No pain with calf compression, swelling, warmth, erythema.   Assessment and Plan:  Status post left 3rd metatarsal osteotomy and toe repair; resolved skin  necrosis however concern for prominent hardware symptomatic onychomycosis   -Treatment options discussed including all alternatives, risks, and complications -I long discussion with patient regarding treatment options. This point I'm concerned for prominent hardware causing irritation just distal to the metatarsal head. I discussed the hardware removal. I discussed the risks and complications of doing this given the recent skin necrosis from the prior surgery however we will not be doing surgery on the toe will likely place a K wire. She elected to go ahead and undergo the surgery to remove the hardware. We'll schedule this for June 7 however I'll see her back before this. I believe the pain she is having in the bunion is the way she is walking it, taped the pain in the lateral the foot.  Darlene Lawson, DPM

## 2015-08-19 ENCOUNTER — Other Ambulatory Visit: Payer: Self-pay | Admitting: Podiatry

## 2015-08-26 ENCOUNTER — Ambulatory Visit (INDEPENDENT_AMBULATORY_CARE_PROVIDER_SITE_OTHER): Payer: BLUE CROSS/BLUE SHIELD | Admitting: Podiatry

## 2015-08-26 DIAGNOSIS — Z967 Presence of other bone and tendon implants: Secondary | ICD-10-CM | POA: Diagnosis not present

## 2015-08-30 NOTE — Progress Notes (Signed)
Patient ID: Darlene Lawson, female   DOB: Mar 20, 1961, 55 y.o.   MRN: [id number]  Subjective: 88 shell female presents the office today for surgical consultation for removal of hardware to the left foot. She said that she is doing better and she can wear regular shoe however she does feels that is poking on the bottom of her foot. She cannot go barefoot because of this. She is no pain with topical surgical site and she only gets pain for which she points distal to the third MPJ. Denies any systemic complaints such as fevers, chills, nausea, vomiting. No acute changes since last appointment, and no other complaints at this time.   Objective: AAO x3, NAD DP/PT pulses palpable bilaterally, CRT less than 3 seconds Incision is well healed at this point without any evidence of dehiscence and scar is formed. There is minimal to trace edema on surgical site without any erythema or increase in warmth. Just distal to the third plantar MPJ there is an area of tenderness which I'm concerned is from the hardware. There is no pain or restriction with MPJ range of motion. No areas of pinpoint bony tenderness or pain with vibratory sensation. MMT 5/5, ROM WNL. No edema, erythema, increase in warmth to bilateral lower extremities.  No open lesions or pre-ulcerative lesions.  No pain with calf compression, swelling, warmth, erythema  Assessment: Prominent hardware right foot  Plan: -All treatment options discussed with the patient including all alternatives, risks, complications.  -Previous x-rays were reviewed with the patient. I discussed the heart removal. I discussed there is a potential would be unable to remove the hardware. If unable to remove the hardware from the dorsal incision on May need to make a plantar incision to access the hardware. She understands this is a possibility. Also given her last surgery with the necrosis and concerned about healing however given her symptoms I will of the hardware needs to  be removed. -The incision placement as well as the postoperative course was discussed with the patient. I discussed risks of the surgery which include, but not limited to, infection, bleeding, pain, swelling, need for further surgery, delayed or nonhealing, painful or ugly scar, numbness or sensation changes, over/under correction, recurrence, transfer lesions, further deformity, hardware failure, DVT/PE, loss of toe/foot. Patient understands these risks and wishes to proceed with surgery. The surgical consent was reviewed with the patient all 3 pages were signed. No promises or guarantees were given to the outcome of the procedure. All questions were answered to the best of my ability. Before the surgery the patient was encouraged to call the office if there is any further questions. The surgery will be performed at the [facility] on an outpatient basis.  Celesta Gentile, DPM

## 2015-09-01 ENCOUNTER — Telehealth: Payer: Self-pay | Admitting: *Deleted

## 2015-09-01 NOTE — Telephone Encounter (Signed)
"  Pre-op nurse needs to know if surgery is going to be on the left or right foot.  They have documentation that says left and they have one that says right.  Give me a call back."  I called and left Renee a message that surgery will be performed on the left foot.

## 2015-09-02 ENCOUNTER — Encounter: Payer: Self-pay | Admitting: Podiatry

## 2015-09-02 DIAGNOSIS — Z4889 Encounter for other specified surgical aftercare: Secondary | ICD-10-CM | POA: Diagnosis not present

## 2015-09-03 ENCOUNTER — Telehealth: Payer: Self-pay | Admitting: *Deleted

## 2015-09-03 NOTE — Telephone Encounter (Signed)
Post op courtesy call-I spoke with pt's husband, Brita Romp said pt was asleep.  I informed Duane, pt should not be weight bearing or dangling that foot more than 15 mins/hour, remain in the boot at all times, leave the dressing in place clean and dry, rest, ice and elevate, and call with concerns.

## 2015-09-07 DIAGNOSIS — Z967 Presence of other bone and tendon implants: Secondary | ICD-10-CM

## 2015-09-09 ENCOUNTER — Encounter: Payer: Self-pay | Admitting: Podiatry

## 2015-09-09 ENCOUNTER — Ambulatory Visit (HOSPITAL_BASED_OUTPATIENT_CLINIC_OR_DEPARTMENT_OTHER)
Admission: RE | Admit: 2015-09-09 | Discharge: 2015-09-09 | Disposition: A | Discharge status: Home / Self Care | Payer: BLUE CROSS/BLUE SHIELD | Source: Ambulatory Visit | Attending: Podiatry | Admitting: Podiatry

## 2015-09-09 ENCOUNTER — Ambulatory Visit (INDEPENDENT_AMBULATORY_CARE_PROVIDER_SITE_OTHER): Payer: BLUE CROSS/BLUE SHIELD | Admitting: Podiatry

## 2015-09-09 DIAGNOSIS — Z9889 Other specified postprocedural states: Secondary | ICD-10-CM | POA: Diagnosis present

## 2015-09-09 DIAGNOSIS — M216X2 Other acquired deformities of left foot: Secondary | ICD-10-CM

## 2015-09-09 DIAGNOSIS — Z967 Presence of other bone and tendon implants: Secondary | ICD-10-CM

## 2015-09-10 NOTE — Progress Notes (Signed)
Patient ID: Darlene Lawson, female   DOB: Aug 08, 1960, 55 y.o.   MRN: [id number]  Subjective: Darlene Lawson is a 55 y.o. is seen today in office s/p left foot ROH preformed on 08/02/15. She states that she is having "good pain". She says that she is having pain from surgery however it already feels better then before this most recent surgery. Her 2nd and 4th toes are contracted.  Denies any systemic complaints such as fevers, chills, nausea, vomiting. No calf pain, chest pain, shortness of breath.   Objective: General: No acute distress, AAOx3  DP/PT pulses palpable 2/4, CRT < 3 sec to all digits.  Protective sensation intact. Motor function intact.  Left foot: Incision is well coapted without any evidence of dehiscence and sutures intact. There is no surrounding erythema, ascending cellulitis, fluctuance, crepitus, malodor, drainage/purulence. There is minimal edema around the surgical site. There is mild pain along the surgical site. The third digits in rectus position. There is hammertoe contracture of the second and fourth digit which is been ongoing does not appear to be changed since the surgery. No other areas of tenderness to bilateral lower extremities.  No other open lesions or pre-ulcerative lesions.  No pain with calf compression, swelling, warmth, erythema.   Assessment and Plan:  Status post left foot ROH, doing well with no complications   -Treatment options discussed including all alternatives, risks, and complications -X-rays ordered. Status post removal hardware third metatarsal. No evidence of acute fracture per my read. -Antibiotic ointment was applied followed by dressing. Keep dressing clean, dry, intact. -Continue surgical shoe. -Ice/elevation -Pain medication as needed. -Monitor for any clinical signs or symptoms of infection and DVT/PE and directed to call the office immediately should any occur or go to the ER. -Follow-up in 1 week for suture removal or sooner if any  problems arise. In the meantime, encouraged to call the office with any questions, concerns, change in symptoms.   Celesta Gentile, DPM

## 2015-09-11 ENCOUNTER — Encounter: Payer: Self-pay | Admitting: Podiatry

## 2015-09-16 ENCOUNTER — Ambulatory Visit: Payer: BLUE CROSS/BLUE SHIELD | Admitting: Podiatry

## 2015-09-16 ENCOUNTER — Encounter: Payer: Self-pay | Admitting: Podiatry

## 2015-09-16 ENCOUNTER — Ambulatory Visit (INDEPENDENT_AMBULATORY_CARE_PROVIDER_SITE_OTHER): Payer: BLUE CROSS/BLUE SHIELD | Admitting: Podiatry

## 2015-09-16 DIAGNOSIS — Z967 Presence of other bone and tendon implants: Secondary | ICD-10-CM

## 2015-09-16 DIAGNOSIS — Z9889 Other specified postprocedural states: Secondary | ICD-10-CM

## 2015-09-17 NOTE — Progress Notes (Signed)
Patient ID: Darlene Lawson, female   DOB: 1960-06-01, 55 y.o.   MRN: [id number]  Subjective: Darlene Lawson is a 55 y.o. is seen today in office s/p left foot ROH preformed on 08/02/15. She states that she is doing much better sheath feels much with that she did prior to the removal of hardware. She has discomfort has not the surgical shoe and she feels better. She states the incision is itching somewhat. Denies any drainage or redness or any swelling. Denies any systemic complaints such as fevers, chills, nausea, vomiting. No calf pain, chest pain, shortness of breath.   Objective: General: No acute distress, AAOx3  DP/PT pulses palpable 2/4, CRT < 3 sec to all digits.  Protective sensation intact. Motor function intact.  Left foot: Incision is well coapted without any evidence of dehiscence and sutures intact. There is no surrounding erythema, ascending cellulitis, fluctuance, crepitus, malodor, drainage/purulence. There is faint edema around the surgical site. There is decreased pain along the surgical site. The third digits in rectus position. There is hammertoe contracture of the second and fourth digit which is been ongoing does not appear to be changed since the surgery. No other areas of tenderness to bilateral lower extremities.  No other open lesions or pre-ulcerative lesions.  No pain with calf compression, swelling, warmth, erythema.   Assessment and Plan:  Status post left foot ROH, doing well with no complications   -Treatment options discussed including all alternatives, risks, and complications -Sutures removed today without couple complications. Continue antibiotic ointment dressing changes daily. -Continued surgical shoe for now. She inserted transition to regular shoe as tolerated. Do not go barefoot. -Continue ice and elevation.. -Monitor for any clinical signs or symptoms of infection and DVT/PE and directed to call the office immediately should any occur or go to the  ER. -Follow-up in 4 weeks or sooner if any problems arise. In the meantime, encouraged to call the office with any questions, concerns, change in symptoms.   Celesta Gentile, DPM

## 2015-10-21 ENCOUNTER — Ambulatory Visit (HOSPITAL_BASED_OUTPATIENT_CLINIC_OR_DEPARTMENT_OTHER)
Admission: RE | Admit: 2015-10-21 | Discharge: 2015-10-21 | Disposition: A | Discharge status: Home / Self Care | Payer: BLUE CROSS/BLUE SHIELD | Source: Ambulatory Visit | Attending: Podiatry | Admitting: Podiatry

## 2015-10-21 ENCOUNTER — Encounter: Payer: Self-pay | Admitting: Podiatry

## 2015-10-21 ENCOUNTER — Ambulatory Visit (INDEPENDENT_AMBULATORY_CARE_PROVIDER_SITE_OTHER): Payer: BLUE CROSS/BLUE SHIELD | Admitting: Podiatry

## 2015-10-21 DIAGNOSIS — Z09 Encounter for follow-up examination after completed treatment for conditions other than malignant neoplasm: Secondary | ICD-10-CM | POA: Diagnosis present

## 2015-10-21 DIAGNOSIS — Z967 Presence of other bone and tendon implants: Secondary | ICD-10-CM

## 2015-10-26 NOTE — Progress Notes (Signed)
Patient ID: Darlene Lawson, female   DOB: Sep 11, 1960, 55 y.o.   MRN: [id number]  Subjective: Darlene Lawson is a 55 y.o. is seen today in office s/p left foot ROH preformed on 08/02/15. She said that she is doing well and her pain is improved compared to what it was prior to surgery. She has some occasional swelling which is able to wear regular shoe. Denies any drainage or redness or any swelling. Denies any systemic complaints such as fevers, chills, nausea, vomiting. No calf pain, chest pain, shortness of breath.   Objective: General: No acute distress, AAOx3  DP/PT pulses palpable 2/4, CRT < 3 sec to all digits.  Protective sensation intact. Motor function intact.  Left foot: Incision is well coapted without any evidence of dehiscence and scar is formed. There is no surrounding erythema, ascending cellulitis, fluctuance, crepitus, malodor, drainage/purulence. There is trace edema around the surgical site. There is decreased pain along the surgical site. No significant hyperkeratotic tissue buildup submetatarsal. The third digits in rectus position.  Hammertoe contractures of lesser digits of the other toes. No other areas of tenderness to bilateral lower extremities.  No other open lesions or pre-ulcerative lesions.  No pain with calf compression, swelling, warmth, erythema.   Assessment and Plan:  Status post left foot ROH, doing well with no complications   -Treatment options discussed including all alternatives, risks, and complications -X-rays obtained and reviewed. -Compression anklet to help with swelling. Elevation. Continue supportive shoe gear. -Follow-up in 4 weeks or sooner.   Celesta Gentile, DPM

## 2015-11-25 ENCOUNTER — Ambulatory Visit (INDEPENDENT_AMBULATORY_CARE_PROVIDER_SITE_OTHER): Payer: BLUE CROSS/BLUE SHIELD | Admitting: Podiatry

## 2015-11-25 ENCOUNTER — Encounter: Payer: Self-pay | Admitting: Podiatry

## 2015-11-25 DIAGNOSIS — L603 Nail dystrophy: Secondary | ICD-10-CM

## 2015-11-25 DIAGNOSIS — Z9889 Other specified postprocedural states: Secondary | ICD-10-CM

## 2015-11-25 DIAGNOSIS — M216X2 Other acquired deformities of left foot: Secondary | ICD-10-CM

## 2015-11-26 NOTE — Progress Notes (Signed)
Patient ID: Estefanita Kulczyk, female   DOB: September 06, 1960, 55 y.o.   MRN: [id number]  Subjective: Zyaire Mietus is a 55 y.o. is seen today in office s/p left foot ROH preformed on 08/02/15. She states that she is doing much better and she is able to wear regular shoe. She is requesting to go back to work on 12/07/2015. She also states that her left big toenail did split and a piece of nail came off and she is getting some tenderness on the nail border. Denies any redness, drainage or any swelling or any signs of infection. Denies any systemic complaints such as fevers, chills, nausea, vomiting. No calf pain, chest pain, shortness of breath.   Objective: General: No acute distress, AAOx3  DP/PT pulses palpable 2/4, CRT < 3 sec to all digits.  Protective sensation intact. Motor function intact.  Left foot: Incision is well coapted without any evidence of dehiscence and scar is formed. There is prominence the metatarsal head plantarly however no tenderness palpation to the third metatarsal. There is no pain on the toe the toes in a rectus position. Mild discomfort on the second metatarsal head plantarly. There is no overlying edema, erythema, increase in warmth. This time the surgical site appears to be healed.  Left hallux toenail on the medial nail border with part of the nail removed. Small amount of nail remaining in the nail border. Upon debridement there is no drainage or pus rate signs of infection. There is tenderness prior to debridement. No other open lesions or pre-ulcerative lesions.  No pain with calf compression, swelling, warmth, erythema.   Assessment and Plan:  Status post left foot ROH, doing well with no complications; left hallux nail onychodystrophy  -Treatment options discussed including all alternatives, risks, and complications -At this time she is doing well and was discharged her from her postoperative care. She is scheduled about or come September 11. Continue metatarsal offloading  pads. Discussed shoe gear modifications. -Left hallux toenail was debrided on the medial nail border. Monitor for signs or symptoms or any recurrence of pain. -Follow-up as needed. Call any questions or concerns or any changes symptoms.  Celesta Gentile, DPM

## 2015-12-03 NOTE — Progress Notes (Signed)
DOS 06.07.2017 Left foot removal of hardware

## 2015-12-07 ENCOUNTER — Encounter: Payer: Self-pay | Admitting: Podiatry

## 2017-01-17 HISTORY — PX: SALIVARY GLAND SURGERY: SHX768

## 2018-01-03 ENCOUNTER — Other Ambulatory Visit: Payer: Self-pay | Admitting: Orthopaedic Surgery

## 2018-01-03 DIAGNOSIS — M25561 Pain in right knee: Secondary | ICD-10-CM

## 2018-01-09 ENCOUNTER — Ambulatory Visit
Admission: RE | Admit: 2018-01-09 | Discharge: 2018-01-09 | Disposition: A | Discharge status: Home / Self Care | Payer: BLUE CROSS/BLUE SHIELD | Source: Ambulatory Visit | Attending: Orthopaedic Surgery | Admitting: Orthopaedic Surgery

## 2018-01-09 DIAGNOSIS — M25561 Pain in right knee: Secondary | ICD-10-CM

## 2018-02-13 DIAGNOSIS — L438 Other lichen planus: Secondary | ICD-10-CM | POA: Insufficient documentation

## 2018-02-13 HISTORY — DX: Other lichen planus: L43.8

## 2019-12-18 DIAGNOSIS — Z872 Personal history of diseases of the skin and subcutaneous tissue: Secondary | ICD-10-CM

## 2019-12-18 HISTORY — DX: Personal history of diseases of the skin and subcutaneous tissue: Z87.2

## 2019-12-27 ENCOUNTER — Other Ambulatory Visit: Payer: Self-pay | Admitting: Family Medicine

## 2019-12-27 DIAGNOSIS — G542 Cervical root disorders, not elsewhere classified: Secondary | ICD-10-CM

## 2020-01-19 ENCOUNTER — Ambulatory Visit
Admission: RE | Admit: 2020-01-19 | Discharge: 2020-01-19 | Disposition: A | Discharge status: Home / Self Care | Payer: BLUE CROSS/BLUE SHIELD | Source: Ambulatory Visit | Attending: Family Medicine | Admitting: Family Medicine

## 2020-01-19 ENCOUNTER — Other Ambulatory Visit: Payer: Self-pay

## 2020-01-19 DIAGNOSIS — G542 Cervical root disorders, not elsewhere classified: Secondary | ICD-10-CM

## 2020-01-29 DIAGNOSIS — U071 COVID-19: Secondary | ICD-10-CM | POA: Diagnosis not present

## 2020-03-30 DIAGNOSIS — M4802 Spinal stenosis, cervical region: Secondary | ICD-10-CM

## 2020-03-30 HISTORY — DX: Spinal stenosis, cervical region: M48.02

## 2020-05-01 DIAGNOSIS — M5412 Radiculopathy, cervical region: Secondary | ICD-10-CM

## 2020-05-01 HISTORY — DX: Radiculopathy, cervical region: M54.12

## 2020-07-09 DIAGNOSIS — R0600 Dyspnea, unspecified: Secondary | ICD-10-CM

## 2020-07-20 ENCOUNTER — Ambulatory Visit: Payer: BLUE CROSS/BLUE SHIELD | Admitting: Podiatry

## 2020-07-23 ENCOUNTER — Other Ambulatory Visit: Payer: Self-pay

## 2020-07-23 ENCOUNTER — Ambulatory Visit: Payer: BC Managed Care – PPO | Admitting: Podiatry

## 2020-07-23 ENCOUNTER — Ambulatory Visit (INDEPENDENT_AMBULATORY_CARE_PROVIDER_SITE_OTHER): Payer: BC Managed Care – PPO

## 2020-07-23 DIAGNOSIS — M79672 Pain in left foot: Secondary | ICD-10-CM

## 2020-07-23 DIAGNOSIS — M778 Other enthesopathies, not elsewhere classified: Secondary | ICD-10-CM

## 2020-07-23 DIAGNOSIS — S9032XA Contusion of left foot, initial encounter: Secondary | ICD-10-CM

## 2020-07-23 DIAGNOSIS — Q828 Other specified congenital malformations of skin: Secondary | ICD-10-CM

## 2020-07-23 DIAGNOSIS — M216X2 Other acquired deformities of left foot: Secondary | ICD-10-CM

## 2020-07-28 NOTE — Progress Notes (Signed)
Subjective:   Patient ID: Darlene Lawson, female   DOB: 60 y.o.   MRN: [id number]   HPI 60 year old female presents the office today for concerns of left foot pain.  She states that she did well after surgery for a while however recently the pain started to come back and she has pain on a daily basis.  She points along the plantar aspect of foot which is majority discomfort mostly the submetatarsal 2.  She also notes that her third toe is now sitting down which causes discomfort when she walks.  She has tried changing shoes without any significant improvement but she continues to have pain on a regular basis.  She did just have a knee steroid injection today as well.  Review of Systems  All other systems reviewed and are negative.  Past Medical History:  Diagnosis Date  . Allergy   . Anxiety   . Menopausal problem   . Restless leg syndrome     Past Surgical History:  Procedure Laterality Date  . bladder tack    . fibroid tumor    . SALIVARY GLAND SURGERY     removed     Current Outpatient Medications:  .  ALPRAZolam (XANAX) 0.25 MG tablet, Take 1 tablet (0.25 mg total) by mouth at bedtime., Disp: 20 tablet, Rfl: 0 .  ALPRAZolam (XANAX) 1 MG tablet, Take 1 tablet (1 mg total) by mouth at bedtime as needed for anxiety., Disp: 30 tablet, Rfl: 0 .  buPROPion (WELLBUTRIN XL) 300 MG 24 hr tablet, , Disp: , Rfl:  .  celecoxib (CELEBREX) 200 MG capsule, Take 1 capsule (200 mg total) by mouth 2 (two) times daily., Disp: 30 capsule, Rfl: 2 .  celecoxib (CELEBREX) 200 MG capsule, TAKE ONE CAPSULE BY MOUTH ONCE DAILY, Disp: 30 capsule, Rfl: 0 .  clindamycin (CLEOCIN) 150 MG capsule, Take 1 capsule (150 mg total) by mouth 3 (three) times daily., Disp: 30 capsule, Rfl: 1 .  clindamycin (CLEOCIN) 150 MG capsule, Take by mouth 3 (three) times daily., Disp: , Rfl:  .  clindamycin (CLEOCIN) 150 MG capsule, Take by mouth 3 (three) times daily., Disp: , Rfl:  .  clindamycin (CLEOCIN) 150 MG capsule,  Take 150 mg by mouth 3 (three) times daily., Disp: , Rfl:  .  clindamycin (CLEOCIN) 300 MG capsule, , Disp: , Rfl:  .  clorazepate (TRANXENE) 3.75 MG tablet, , Disp: , Rfl:  .  clotrimazole-betamethasone (LOTRISONE) cream, , Disp: , Rfl:  .  fluconazole (DIFLUCAN) 200 MG tablet, , Disp: , Rfl:  .  fluticasone (FLONASE) 50 MCG/ACT nasal spray, , Disp: , Rfl:  .  FLUVIRIN 0.5 ML SUSY, , Disp: , Rfl:  .  FLUVIRIN PRESERVATIVE FREE SUSP injection, , Disp: , Rfl:  .  levofloxacin (LEVAQUIN) 500 MG tablet, , Disp: , Rfl:  .  levothyroxine (SYNTHROID, LEVOTHROID) 150 MCG tablet, , Disp: , Rfl:  .  levothyroxine (SYNTHROID, LEVOTHROID) 175 MCG tablet, , Disp: , Rfl:  .  methylPREDNISolone (MEDROL DOSEPAK) 4 MG TBPK tablet, TAKE AS DIRECTED., Disp: 21 tablet, Rfl: 1 .  MIMVEY 1-0.5 MG per tablet, , Disp: , Rfl:  .  oxyCODONE-acetaminophen (PERCOCET/ROXICET) 5-325 MG per tablet, , Disp: , Rfl:  .  oxyCODONE-acetaminophen (PERCOCET/ROXICET) 5-325 MG tablet, Take by mouth every 6 (six) hours as needed for severe pain., Disp: , Rfl:  .  oxyCODONE-acetaminophen (PERCOCET/ROXICET) 5-325 MG tablet, Take by mouth every 4 (four) hours as needed for severe pain (take 2 tablets by  mouth every 4 - 6 hours as needed for pain)., Disp: , Rfl:  .  oxyCODONE-acetaminophen (ROXICET) 5-325 MG tablet, Take 1 tablet by mouth every 6 (six) hours as needed for severe pain., Disp: 20 tablet, Rfl: 0 .  predniSONE (DELTASONE) 20 MG tablet, , Disp: , Rfl:  .  PREVNAR 13 SUSP injection, , Disp: , Rfl:  .  promethazine (PHENERGAN) 12.5 MG tablet, Take 12.5 mg by mouth every 6 (six) hours as needed for nausea or vomiting., Disp: , Rfl:  .  promethazine (PHENERGAN) 25 MG tablet, Take 25 mg by mouth every 6 (six) hours as needed for nausea or vomiting (take 1 tablet by mouth every 6 - 8 hours as needed for nausea/vomiting)., Disp: , Rfl:  .  rOPINIRole (REQUIP) 2 MG tablet, , Disp: , Rfl:  .  triamcinolone cream (KENALOG) 0.5 %, ,  Disp: , Rfl:  .  valACYclovir (VALTREX) 1000 MG tablet, , Disp: , Rfl:  .  VESICARE 10 MG tablet, , Disp: , Rfl:  .  zolpidem (AMBIEN) 5 MG tablet, Take 1 tablet (5 mg total) by mouth at bedtime as needed for sleep., Disp: 15 tablet, Rfl: 1  Allergies  Allergen Reactions  . Penicillins   . Sulfa Antibiotics Rash         Objective:  Physical Exam  General: AAO x3, NAD  Dermatological: Annular hyperkeratotic lesion submetatarsal 2.  No ulcerations identified.  Vascular: Dorsalis Pedis artery and Posterior Tibial artery pedal pulses are 2/4 bilateral with immedate capillary fill time. There is no pain with calf compression, swelling, warmth, erythema.   Neruologic: Grossly intact via light touch bilateral.   Musculoskeletal: Third toe does sit plantarflexed.  There is prominence of metatarsal heads with tenderness submetatarsal 2 and 3 mostly.  There is atrophy of the fat pad.  There is decreasing to motion of the third MPJ.  Tailor's bunion present.  Muscular strength 5/5 in all groups tested bilateral.  Gait: Unassisted, Nonantalgic.       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  Arthritic changes present to the third MPJ.  No evidence of acute fracture. -She will be debrided hyperkeratotic lesion submetatarsal 2 without any complications or bleeding. -We did discussion regards with conservative as well as surgical treatment options.  I do think that she will benefit from surgery however she is recently had a lot of complications after recovering from [place] and she has had blood clots since on blood thinners and she is also still on oxygen.  I like for her other medical issues to resolve before  proceeding with elective surgery. -I did try to make her an orthotic today to help offload the third MPJ the metatarsal heads.  She was measured for inserts today. -Held off another steroid injection as she just had 1 in her knee today as well.  Trula Slade DPM

## 2020-08-03 ENCOUNTER — Encounter: Payer: Self-pay | Admitting: *Deleted

## 2020-08-03 ENCOUNTER — Encounter: Payer: Self-pay | Admitting: Cardiology

## 2020-08-20 ENCOUNTER — Ambulatory Visit (INDEPENDENT_AMBULATORY_CARE_PROVIDER_SITE_OTHER): Payer: BC Managed Care – PPO | Admitting: Podiatry

## 2020-08-20 ENCOUNTER — Other Ambulatory Visit: Payer: Self-pay

## 2020-08-20 DIAGNOSIS — M778 Other enthesopathies, not elsewhere classified: Secondary | ICD-10-CM

## 2020-08-20 DIAGNOSIS — M216X2 Other acquired deformities of left foot: Secondary | ICD-10-CM

## 2020-08-21 ENCOUNTER — Encounter: Payer: Self-pay | Admitting: Internal Medicine

## 2020-08-21 ENCOUNTER — Ambulatory Visit: Payer: BC Managed Care – PPO | Admitting: Internal Medicine

## 2020-08-21 DIAGNOSIS — G2581 Restless legs syndrome: Secondary | ICD-10-CM | POA: Insufficient documentation

## 2020-08-21 DIAGNOSIS — R0600 Dyspnea, unspecified: Secondary | ICD-10-CM

## 2020-08-21 DIAGNOSIS — J309 Allergic rhinitis, unspecified: Secondary | ICD-10-CM | POA: Insufficient documentation

## 2020-08-21 DIAGNOSIS — M1990 Unspecified osteoarthritis, unspecified site: Secondary | ICD-10-CM | POA: Insufficient documentation

## 2020-08-21 DIAGNOSIS — R0609 Other forms of dyspnea: Secondary | ICD-10-CM

## 2020-08-21 DIAGNOSIS — F329 Major depressive disorder, single episode, unspecified: Secondary | ICD-10-CM | POA: Insufficient documentation

## 2020-08-21 DIAGNOSIS — J4530 Mild persistent asthma, uncomplicated: Secondary | ICD-10-CM | POA: Insufficient documentation

## 2020-08-21 DIAGNOSIS — I824Z1 Acute embolism and thrombosis of unspecified deep veins of right distal lower extremity: Secondary | ICD-10-CM | POA: Insufficient documentation

## 2020-08-21 DIAGNOSIS — I700 Atherosclerosis of aorta: Secondary | ICD-10-CM | POA: Insufficient documentation

## 2020-08-21 DIAGNOSIS — K219 Gastro-esophageal reflux disease without esophagitis: Secondary | ICD-10-CM | POA: Insufficient documentation

## 2020-08-21 DIAGNOSIS — F419 Anxiety disorder, unspecified: Secondary | ICD-10-CM | POA: Insufficient documentation

## 2020-08-21 DIAGNOSIS — F5104 Psychophysiologic insomnia: Secondary | ICD-10-CM | POA: Insufficient documentation

## 2020-08-21 DIAGNOSIS — E039 Hypothyroidism, unspecified: Secondary | ICD-10-CM | POA: Insufficient documentation

## 2020-08-21 DIAGNOSIS — L519 Erythema multiforme, unspecified: Secondary | ICD-10-CM | POA: Insufficient documentation

## 2020-08-21 DIAGNOSIS — J9611 Chronic respiratory failure with hypoxia: Secondary | ICD-10-CM | POA: Insufficient documentation

## 2020-08-21 DIAGNOSIS — L03115 Cellulitis of right lower limb: Secondary | ICD-10-CM | POA: Insufficient documentation

## 2020-08-21 DIAGNOSIS — D6869 Other thrombophilia: Secondary | ICD-10-CM | POA: Insufficient documentation

## 2020-08-21 DIAGNOSIS — Z9981 Dependence on supplemental oxygen: Secondary | ICD-10-CM | POA: Insufficient documentation

## 2020-08-21 DIAGNOSIS — G4733 Obstructive sleep apnea (adult) (pediatric): Secondary | ICD-10-CM | POA: Insufficient documentation

## 2020-08-21 DIAGNOSIS — N3281 Overactive bladder: Secondary | ICD-10-CM | POA: Insufficient documentation

## 2020-08-21 DIAGNOSIS — N959 Unspecified menopausal and perimenopausal disorder: Secondary | ICD-10-CM | POA: Insufficient documentation

## 2020-08-21 NOTE — Progress Notes (Signed)
Darlene Lawson, female    DOB: 04-Feb-1961    MRN: [id number]   Brief patient profile:  69 yowf quit smoking 1993  At wt  175 very active regular walking then R  knee slowed her down more breathing then Jan 21 2021 > d/c 02/01/20 d/c on 4lpm and tapered off prednisone mid Nov 2021 with improving doe but not even 50% with wt gain of 13 lb     History of Present Illness  08/21/2020  Pulmonary/ 1st office eval/[employer]  Chief Complaint  Patient presents with  . Pulmonary Consult    Referred by Dr Darlene Lawson. Pt had covid 19 and pna Oct 2021 and was hospitalized- she has had SOB since then. She works at [employer] and has SOB walking to the breakroom and has to stop and rest. She states never has any energy.   Dyspnea: length of [employer]  Has to stop 3/4 way Cough: minimal Sleep: bed is flat, head is up 20 degrees on 4lpm sleeps fine, rec though was for cpap says can't afford it and req referral to sleep medicine here  SABA use: only uses p gets really sob p exertion, never pre or re challenges    No obvious day to day or daytime variability or assoc excess/ purulent sputum or mucus plugs or hemoptysis or cp or chest tightness, subjective wheeze or overt sinus or hb symptoms.   Sleeping  without nocturnal  or early am exacerbation  of respiratory  c/o's or need for noct saba. Also denies any obvious fluctuation of symptoms with weather or environmental changes or other aggravating or alleviating factors except as outlined above   No unusual exposure hx or h/o childhood pna/ asthma or knowledge of premature birth.  Current Allergies, Complete Past Medical History, Past Surgical History, Family History, and Social History were reviewed in [facility] record.  ROS  The following are not active complaints unless bolded Hoarseness, sore throat, dysphagia, dental problems, itching, sneezing,  nasal congestion or discharge of excess mucus or purulent secretions, ear ache,    fever, chills, sweats, unintended wt loss or wt gain, classically pleuritic or exertional cp,  orthopnea pnd or arm/hand swelling  or leg swelling, presyncope, palpitations, abdominal pain, anorexia, nausea, vomiting, diarrhea  or change in bowel habits or change in bladder habits, change in stools or change in urine, dysuria, hematuria,  rash, arthralgias, visual complaints, headache, numbness, weakness or ataxia or problems with walking or coordination,  change in mood or  memory.           Past Medical History:  Diagnosis Date  . Acquired thrombophilia ([employer])   . Allergic rhinitis with postnasal drip   . Anxiety   . Aortic atherosclerosis ([employer])   . Cellulitis of right lower extremity without foot   . Chronic insomnia   . Chronic respiratory failure with hypoxia ([employer])   . DOE (dyspnea on exertion)   . Erythema multiforme   . GERD (gastroesophageal reflux disease)   . Hammertoe 06/18/2015  . History of erythema multiforme 12/18/2019  . Hypothyroidism   . Ingrown nail 03/15/2015  . Lower leg DVT (deep venous thromboembolism), acute, right ([employer]ene [employer])   . Major depressive disorder with single episode   . Menopausal problem   . Mild persistent asthma, uncomplicated   . Morbid obesity ([employer])   . Morton's neuroma of left foot 12/26/2012   Second and third intersoace neuromas noted.     . Oral lichen planus 73/22/0254  .  OSA (obstructive sleep apnea)   . Osteoarthritis   . Overactive bladder   . Pain in joint, ankle and foot 12/26/2012  . Plantar fasciitis, left 12/26/2012   Managed with orthoses and NSAIDS   . Plantar flexed metatarsal 06/18/2015  . Radiculopathy, cervical region 05/01/2020  . Restless leg syndrome   . Spinal stenosis in cervical region 03/30/2020  . Supplemental oxygen dependent   . Surgery follow-up 06/18/2015    Outpatient Medications Prior to Visit  Medication Sig Dispense Refill  . ascorbic acid (VITAMIN C) 500 MG tablet Take 500 mg by mouth daily.    . Biotin 5 MG TBDP  Take 5 mg by mouth daily.    . Calcium Carb-Cholecalciferol 600-400 MG-UNIT TABS Take 1 tablet by mouth daily.    . calcium carbonate (OSCAL) 1500 (600 Ca) MG TABS tablet Take 600 mg of elemental calcium by mouth daily with breakfast.    . Cholecalciferol 50 MCG (2000 UT) CAPS Take 2,000 Units by mouth daily.    . clobetasol cream (TEMOVATE) 4.01 % Apply 1 application topically 2 (two) times daily.    . clorazepate (TRANXENE) 3.75 MG tablet Take 3.75 mg by mouth at bedtime as needed for anxiety.    . clotrimazole (MYCELEX) 10 MG troche Take 10 mg by mouth daily as needed.    . cycloSPORINE (SANDIMMUNE) 100 MG capsule Take 100 mg by mouth daily.    Marland Kitchen esomeprazole (NEXIUM) 20 MG capsule Take 20 mg by mouth daily at 12 noon.    . finasteride (PROSCAR) 5 MG tablet Take 5 mg by mouth daily.    . fluticasone (FLONASE) 50 MCG/ACT nasal spray Place 2 sprays into both nostrils daily.    . folic acid (FOLVITE) 1 MG tablet Take 1 mg by mouth daily.    . furosemide (LASIX) 20 MG tablet Take 20 mg by mouth daily as needed for edema.    [facility] Ultra Strength 1500 MG CAPS Take 1 capsule by mouth daily.    Marland Kitchen levothyroxine (SYNTHROID, LEVOTHROID) 175 MCG tablet Take 175 mcg by mouth daily before breakfast.    . loratadine-pseudoephedrine (CLARITIN-D 24-HOUR) 10-240 MG 24 hr tablet Take 1 tablet by mouth daily.    . magnesium 30 MG tablet Take 30 mg by mouth 2 (two) times daily.    . methotrexate (RHEUMATREX) 2.5 MG tablet Take 2.5 mg by mouth once a week.    . metroNIDAZOLE (METROGEL) 1 % gel Apply 1 application topically daily.    Marland Kitchen MYRBETRIQ 50 MG TB24 tablet Take 50 mg by mouth daily.    . tacrolimus (PROGRAF) 1 MG capsule Take 1 mg by mouth every 12 (twelve) hours.    [facility] ELLIPTA 200-62.5-25 MCG/INH AEPB Inhale 1 puff into the lungs daily.    . VESICARE 10 MG tablet Take 10 mg by mouth at bedtime.    . vitamin B-12 (CYANOCOBALAMIN) 500 MCG tablet Take 500 mcg by mouth daily.    . vitamin E 180  MG (400 UNITS) capsule Take 400 Units by mouth daily.    [facility]ene [facility] 20 MG TABS tablet Take 20 mg by mouth daily.    Marland Kitchen glucosamine-chondroitin 500-400 MG tablet Take 1 tablet by mouth daily.     No facility-administered medications prior to visit.     Objective:     BP 118/70 (BP Location: Left Arm, Cuff Size: Normal)   Pulse 76   Temp 97.9 F (36.6 C) (Temporal)   Ht 5\' 3"  (1.6  m)   Wt 240 lb 12.8 oz (109.2 kg)   SpO2 99% Comment: on RA  BMI 42.66 kg/m   SpO2: 99 % (on RA)   Pleasant amb wf nad   HEENT : pt wearing mask not removed for exam due to covid -19 concerns.    NECK :  without JVD/Nodes/TM/ nl carotid upstrokes bilaterally   LUNGS: no acc muscle use,  Nl contour chest which is clear to A and P bilaterally without cough on insp or exp maneuvers   CV:  RRR  no s3 or murmur or increase in P2, and treace edema with R leg in tight elastic hose    ABD:  Obese soft and nontender with nl inspiratory excursion in the supine position. No bruits or organomegaly appreciated, bowel sounds nl  MS: slt awkward gait/ ext warm without deformities, calf tenderness, cyanosis or clubbing No obvious joint restrictions   SKIN: warm and dry without lesions    NEURO:  alert, approp, nl sensorium with  no motor or cerebellar deficits apparent.      I personally reviewed images and agree with radiology impression as follows:   Chest CT  05/08/20  s contrast Marked interval improvement in widespread pulmonary infiltrates in keeping with improving inflammatory changes related to COVID pneumonia. Improved pulmonary insufflation with now normal pulmonary volumes.         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  The challenge here will be finding an exercise she can do regularly to build up to a minimum of 30 min daily with goal of 30 min twice daily if tolerates.  In meantime,  Note : Symptoms are markedly disproportionate to objective findings and not clear to what extent this is actually a pulmonary  problem but pt does appear to have difficult to sort out respiratory symptoms of unknown origin for which  DDX  = almost all start with A and  include Adherence, Ace Inhibitors, Acid Reflux, Active Sinus Disease, Alpha 1 Antitripsin deficiency, Anxiety masquerading as Airways dz,  ABPA,  Allergy(esp in young), Aspiration (esp in elderly), Adverse effects of meds,  Active smoking or Vaping, A bunch of PE's/clot burden (a few small clots can't cause this syndrome unless there is already severe underlying pulm or vascular dz with poor reserve),  Anemia or thyroid disorder, plus two Bs  = Bronchiectasis and Beta blocker use..and one C= CHF     Adherence is always the initial "prime suspect" and is a multilayered concern that requires a "trust but verify" approach in every patient - starting with knowing how to use medications, especially inhalers, correctly, keeping up with refills and understanding the fundamental difference between maintenance and prns vs those medications only taken for a very short course and then stopped and not refilled.   ?allergy/asthma > strongly doubt  Advised: I spent extra time with pt today reviewing appropriate use of albuterol for prn use  on exertion with the following points: 1) saba is for relief of sob that does not improve by walking a slower pace or resting but rather if the pt does not improve after trying this first. 2) If the pt is convinced, as  many are, that saba helps recover from activity faster then it's easy to tell if this is the case by re-challenging : ie stop, take the inhaler, then p 5 minutes try the exact same activity (intensity of workload) that just caused the symptoms and Lawson if they are substantially diminished or not after saba 3) if there is an activity that reproducibly causes the symptoms, try the saba 15 min before the activity on alternate days   If in fact the saba really does help, then fine to continue to use it prn but advised may need to look closer at the maintenance regimen being used to achieve better control of airways disease with exertion.   ? Adverse drug effect > again I doubt she has asthma with a fixed pattern of doe and only restrictive changes on pfts so just as likely the trelegy is just adding to side effects / cost and not improving ex tol so ok to try off  - no evidence mtx pulmonary toxicity by CT post covid   ? Anxiety/depression/ deconditioning  > usually at the bottom of this list of usual suspects but  note already on psychotropics and may interfere with adherence and also interpretation of response or lack thereof to symptom management which can be quite subjective.   ? Anemia/ thyroid disorder > presumably already addressed by PCP  ? A bunch of PE's > at risk but should be fine on DOAC for now  ? chf > says w/u neg but hasn't seen cardiologist and no records availabe > f/u at PCP discretion    Morbid obesity due to excess calories ([place]) Body mass index is 42.66 kg/m.     No results found for: TSH   Contributing to OSA/ Doe and risk for gerd/ diastolic dysfunction //reviewed the need and the process to achieve and maintain neg calorie balance > defer f/u primary care including intermittently monitoring thyroid status    Referred to sleep medicine for issue of  ? OSA    Each maintenance medication was reviewed in detail including emphasizing most importantly the difference between  maintenance and prns and under what circumstances the prns are to be triggered using an action plan format where appropriate.  Total time for H and P, chart review, counseling,  directly observing portions of ambulatory 02 saturation study/ and generating customized AVS unique to this office visit / same day charting => 60 min             Christinia Gully, MD 08/21/2020

## 2020-08-21 NOTE — Patient Instructions (Signed)
Stop trelegy for now and see how you do with your walking.    Try albuterol 15 min before an activity (on alternating days)  that you know would make you short of breath and see if it makes any difference and if makes none then don't take albuterol after activity unless you can't catch your breath as this means it's the resting that helps, not the albuterol.   To get the most out of exercise, you need to be continuously aware that you are short of breath, but never out of breath, for at least 20- 30 minutes daily. As you improve, it will actually be easier for you to do the same amount of exercise  in  30 minutes so always push to the level where you are short of breath.  Once you can do this, push for longer duration or repeat it after at least 4 hours of rest.  Make sure you check your oxygen saturations at highest level of activity    We will set you up with a sleep doctor here and just continue the 02 as your are for now.

## 2020-08-22 ENCOUNTER — Encounter: Payer: Self-pay | Admitting: Internal Medicine

## 2020-08-22 NOTE — Assessment & Plan Note (Addendum)
Body mass index is 42.66 kg/m.     No results found for: TSH   Contributing to OSA/Doe and risk for gerd/ diastolic dysfunction //reviewed the need and the process to achieve and maintain neg calorie balance > defer f/u primary care including intermittently monitoring thyroid status    Referred to sleep medicine for issue of  ? OSA

## 2020-08-22 NOTE — Assessment & Plan Note (Addendum)
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  The challenge here will be finding an exercise she can do regularly to build up to a minimum of 30 min daily with goal of 30 min twice daily if tolerates.  In meantime,  Note : Symptoms are markedly disproportionate to objective findings and not clear to what extent this is actually a pulmonary  problem but pt does appear to have difficult to sort out respiratory symptoms of unknown origin for which  DDX  = almost all start with A and  include Adherence, Ace Inhibitors, Acid Reflux, Active Sinus Disease, Alpha 1 Antitripsin deficiency, Anxiety masquerading as Airways dz,  ABPA,  Allergy(esp in young), Aspiration (esp in elderly), Adverse effects of meds,  Active smoking or Vaping, A bunch of PE's/clot burden (a few small clots can't cause this syndrome unless there is already severe underlying pulm or vascular dz with poor reserve),  Anemia or thyroid disorder, plus two Bs  = Bronchiectasis and Beta blocker use..and one C= CHF     Adherence is always the initial "prime suspect" and is a multilayered concern that requires a "trust but verify" approach in every patient - starting with knowing how to use medications, especially inhalers, correctly, keeping up with refills and understanding the fundamental difference between maintenance and prns vs those medications only taken for a very short course and then stopped and not refilled.   ?allergy/asthma > strongly doubt  Advised: I spent extra time with pt today  reviewing appropriate use of albuterol for prn use on exertion with the following points: 1) saba is for relief of sob that does not improve by walking a slower pace or resting but rather if the pt does not improve after trying this first. 2) If the pt is convinced, as many are, that saba helps recover from activity faster then it's easy to tell if this is the case by re-challenging : ie stop, take the inhaler, then p 5 minutes try the exact same activity (intensity of workload) that just caused the symptoms and see if they are substantially diminished or not after saba 3) if there is an activity that reproducibly causes the symptoms, try the saba 15 min before the activity on alternate days   If in fact the saba really does help, then fine to continue to use it prn but advised may need to look closer at the maintenance regimen being used to achieve better control of airways disease with exertion.   ? Adverse drug effect > again I doubt she has asthma with a fixed pattern of doe and only restrictive changes on pfts so just as likely the trelegy is just adding to side effects / cost and not improving ex tol so ok to try off  - no evidence mtx pulmonary toxicity by CT post covid   ? Anxiety/depression/ deconditioning  > usually at the bottom of this list of usual suspects but  note already on psychotropics and may  interfere with adherence and also interpretation of response or lack thereof to symptom management which can be quite subjective.   ? Anemia/ thyroid disorder > presumably already addressed by PCP  ? A bunch of PE's > at risk but should be fine on DOAC for now  ? chf > says w/u neg but hasn't seen cardiologist and no records availabe > f/u at PCP discretion   Each maintenance medication was reviewed in detail including emphasizing most importantly the difference between maintenance and prns and under what circumstances the prns are to be triggered using an action plan format where  appropriate.  Total time for H and P, chart review, counseling,  directly observing portions of ambulatory 02 saturation study/ and generating customized AVS unique to this office visit / same day charting => 60 min

## 2020-08-23 NOTE — Progress Notes (Signed)
Patient presents to pick up orthotics.  Upon trying the monitor she was rolling laterally.  I did a lateral wedge to see if this will be helpful.  Oral and written break-in instructions discussed.  She has upcoming appointment with her pulmonologist.  Discussed with her clearance prior to surgery.  Trula Slade DPM

## 2020-08-30 NOTE — Progress Notes (Signed)
Cardiology Office Note:    Date:  08/31/2020   ID:  Darlene Lawson, DOB 1960/08/02, MRN [id number]  PCP:  Street, Sharon Mt, MD  Cardiologist:  Shirlee More, MD   Referring MD: [address], Sharon Mt, *  ASSESSMENT:    1. SOB (shortness of breath)   2. Chest pain of uncertain etiology   3. PAF (paroxysmal atrial fibrillation) ([place])   4. History of DVT (deep vein thrombosis)   5. Coronary artery calcification seen on CT scan    PLAN:    In order of problems listed above:  1. This is a very complicated visit with multiple etiologies of heart failure including post-COVID lung injury, pulmonary injury from venous thromboembolism atrial arrhythmia heart failure and CAD.  Despite the echo report from April she appears to have heart failure she has peripheral edema moderate neck vein distention exertional shortness of breath and prominent symptoms of orthopnea.  With her being anticoagulated I will check her CBC today renal function and a proBNP level.  When I am next at [facility] I will independently review her echocardiogram.  She will increase her loop diuretic furosemide to 40 mg daily. 2. She is having exertional chest pain is coronary artery calcification urgent cardiac CTA will be performed. 3. She remains anticoagulate apply a 7-day event monitor if she has recurrent episodes require suppressive and antiarrhythmic therapy 4. Continue her current anticoagulant, if further episodes of atrial fibrillation should remain on for stroke prophylaxis 5. Check cardiac CTA with coronary calcification exertional chest pain  Next appointment 6 weeks   Medication Adjustments/Labs and Tests Ordered: Current medicines are reviewed at length with the patient today.  Concerns regarding medicines are outlined above.  Orders Placed This Encounter  Procedures  . CT CORONARY MORPH W/CTA COR W/SCORE W/CA W/CM &/OR WO/CM  . Basic metabolic panel  . Pro b natriuretic peptide (BNP)  . CBC  .  LONG TERM MONITOR (3-14 DAYS)  . EKG 12-Lead   Meds ordered this encounter  Medications  . furosemide (LASIX) 40 MG tablet    Sig: Take 1 tablet (40 mg total) by mouth daily.    Dispense:  90 tablet    Refill:  3  . metoprolol tartrate (LOPRESSOR) 100 MG tablet    Sig: Take 1 tablet (100 mg total) by mouth once for 1 dose. Take two hours prior to your cardiac CT    Dispense:  1 tablet    Refill:  0     Chief Complaint  Patient presents with  . Shortness of Breath    History of Present Illness:    Darlene Lawson is a 60 y.o. female who is being seen today for the evaluation of shortness of breath at the request of Street, Sharon Mt, *.  She has a history of deep vein thrombosis in January 2022 after hospitalization with COVID-pneumonia treated with Xarelto and asthma and was seen in pulmonary consultation with Dr. Melvyn Novas. She was seen 08/21/2020 and concern was raised regarding heart failure.  Recent labs 08/18/2020 sodium 140 potassium 3.8 creatinine 0.62 GFR greater than 90 cc hemoglobin 13.3 Venous duplex right lower extremity 08/17/2020 showed no findings of deep vein thrombosis.  Echocardiogram at [facility] 07/09/2020 showed normal left ventricular size wall thickness normal left ventricular systolic function EF 55 to 32% normal diastolic function right ventricle is normal pulmonary pressures were not elevated and there was no significant valvular abnormality.  CT scan of the chest 05/08/2020 showed marked interval improvement in  widespread pulmonary infiltrates related to COVID-pneumonia coronary artery calcification and aortic atherosclerosis was noted.  She was admitted to [facility] health 01/29/2020 to 02/01/2020 with COVID-19 pneumonia.  On presentation her respiratory rate was 40/min oxygen saturation 86% on room air she required 6 L of supplemental oxygen.  COVID-19 PCR test was positive chest x-ray showed patchy bilateral infiltrates CT scan showed multifocal  pneumonia.  She admitted to the hospital and treated with dexamethasone and remdesivir and empiric antibiotic therapy.  Other problems included hyponatremia corrected by the time of discharge ongoing oxygen requirements 3 L/min at discharge.  02/01/2020 white count 7100 hemoglobin 13.9 BMP was normal.  EKG 01/29/2020 independently reviewed showed atrial fibrillation rapid response 110 bpm  This is a very complicated visit Prior to COVID-19 pneumonia she had no shortness of breath. She is improved but is still short of breath walking through [employer] she has increasing peripheral edema despite taking a diuretic and has orthopnea and uses nocturnal oxygen. She is placed on a loop diuretic by her PCP for edema. She has had episodes of substernal chest pain last for a few minutes with activity and associated shortness of breath She has coronary artery calcification on CT scan She remains anticoagulated on Xarelto. She was aware she had atrial fibrillation in the hospital.  She has had no episodes of rapid heart rhythm.  Past Medical History:  Diagnosis Date  . Acquired thrombophilia ([place])   . Allergic rhinitis with postnasal drip   . Anxiety   . Aortic atherosclerosis ([place])   . Cellulitis of right lower extremity without foot   . Chronic insomnia   . Chronic respiratory failure with hypoxia ([place])   . DOE (dyspnea on exertion)   . Erythema multiforme   . GERD (gastroesophageal reflux disease)   . Hammertoe 06/18/2015  . History of erythema multiforme 12/18/2019  . Hypothyroidism   . Ingrown nail 03/15/2015  . Lower leg DVT (deep venous thromboembolism), acute, right ([place])   . Major depressive disorder with single episode   . Menopausal problem   . Mild persistent asthma, uncomplicated   . Morbid obesity ([place])   . Morton's neuroma of left foot 12/26/2012   Second and third intersoace neuromas noted.     . Oral lichen planus 03/54/6568  . OSA (obstructive sleep apnea)   . Osteoarthritis    . Overactive bladder   . Pain in joint, ankle and foot 12/26/2012  . Plantar fasciitis, left 12/26/2012   Managed with orthoses and NSAIDS   . Plantar flexed metatarsal 06/18/2015  . Radiculopathy, cervical region 05/01/2020  . Restless leg syndrome   . Spinal stenosis in cervical region 03/30/2020  . Supplemental oxygen dependent   . Surgery follow-up 06/18/2015    Past Surgical History:  Procedure Laterality Date  . bladder tack    . fibroid tumor    . LAMINECTOMY  02/2013  . SALIVARY GLAND SURGERY  01/17/2017   removed    Current Medications: Current Meds  Medication Sig  . ascorbic acid (VITAMIN C) 500 MG tablet Take 500 mg by mouth daily.  . Biotin 5 MG TBDP Take 5 mg by mouth daily.  . Calcium Carb-Cholecalciferol 600-400 MG-UNIT TABS Take 1 tablet by mouth daily.  . calcium carbonate (OSCAL) 1500 (600 Ca) MG TABS tablet Take 600 mg of elemental calcium by mouth daily with breakfast.  . Cholecalciferol 50 MCG (2000 UT) CAPS Take 2,000 Units by mouth daily.  . clobetasol cream (TEMOVATE) 1.27 % Apply 1 application  topically 2 (two) times daily as needed ([place]ash).  . clorazepate (TRANXENE) 3.75 MG tablet Take 3.75 mg by mouth at bedtime as needed for anxiety.  . clotrimazole (MYCELEX) 10 MG troche Take 10 mg by mouth daily as needed (Mouth sore).  . cycloSPORINE (SANDIMMUNE) 100 MG capsule Take 100 mg by mouth daily as needed (Breakout).  Marland Kitchen esomeprazole (NEXIUM) 20 MG capsule Take 20 mg by mouth daily at 12 noon.  . finasteride (PROSCAR) 5 MG tablet Take 5 mg by mouth daily.  . fluticasone (FLONASE) 50 MCG/ACT nasal spray Place 2 sprays into both nostrils daily as needed for allergies or rhinitis.  . folic acid (FOLVITE) 1 MG tablet Take 1 mg by mouth daily.  . furosemide (LASIX) 40 MG tablet Take 1 tablet (40 mg total) by mouth daily.  Astrid Drafts Ultra Strength 1500 MG CAPS Take 1 capsule by mouth daily.  Marland Kitchen levothyroxine (SYNTHROID, LEVOTHROID) 175 MCG tablet Take 175 mcg by  mouth daily before breakfast.  . loratadine-pseudoephedrine (CLARITIN-D 24-HOUR) 10-240 MG 24 hr tablet Take 1 tablet by mouth daily as needed for allergies.  . Magnesium 250 MG TABS Take 250 mg by mouth 2 (two) times daily.  . methotrexate (RHEUMATREX) 2.5 MG tablet Take 2.5 mg by mouth once a week.  . metoprolol tartrate (LOPRESSOR) 100 MG tablet Take 1 tablet (100 mg total) by mouth once for 1 dose. Take two hours prior to your cardiac CT  . metroNIDAZOLE (METROGEL) 1 % gel Apply 1 application topically daily.  Marland Kitchen MYRBETRIQ 50 MG TB24 tablet Take 50 mg by mouth daily.  . tacrolimus (PROGRAF) 1 MG capsule Take 1 mg by mouth every 12 (twelve) hours as needed (Breakout rash / mout sore).  . VESICARE 10 MG tablet Take 10 mg by mouth at bedtime.  . vitamin B-12 (CYANOCOBALAMIN) 500 MCG tablet Take 500 mcg by mouth daily.  . vitamin E 180 MG (400 UNITS) capsule Take 400 Units by mouth daily.  Alveda Reasons 20 MG TABS tablet Take 20 mg by mouth daily.  . [DISCONTINUED] furosemide (LASIX) 20 MG tablet Take 20 mg by mouth daily as needed for edema.     Allergies:   Penicillins, Prednisone, and Sulfa antibiotics   Social History   Socioeconomic History  . Marital status: Married    Spouse name: Not on file  . Number of children: Not on file  . Years of education: Not on file  . Highest education level: Not on file  Occupational History  . Not on file  Tobacco Use  . Smoking status: Former Smoker    Packs/day: 1.50    Years: 20.00    Pack years: 30.00    Types: Cigarettes    Quit date: 1993    Years since quitting: 29.4  . Smokeless tobacco: Never Used  Substance and Sexual Activity  . Alcohol use: No  . Drug use: No  . Sexual activity: Not on file  Other Topics Concern  . Not on file  Social History Narrative  . Not on file   Social Determinants of Health   Financial Resource Strain: Not on file  Food Insecurity: Not on file  Transportation Needs: Not on file  Physical Activity:  Not on file  Stress: Not on file  Social Connections: Not on file     Family History: The patient's family history includes COPD in her father; Diabetes in her brother, father, mother, and sister; Heart disease in her mother; Lung cancer in her  father; Stroke in her father.  ROS:   ROS Please see the history of present illness.     All other systems reviewed and are negative.  EKGs/Labs/Other Studies Reviewed:    The following studies were reviewed today:   EKG:  EKG is  ordered today.  The ekg ordered today is personally reviewed and demonstrates sinus rhythm normal EKG  Physical Exam:    VS:  BP 106/70 (BP Location: Right Arm, Cuff Size: Large)   Pulse 76   Ht 5\' 3"  (1.6 m)   Wt 240 lb (108.9 kg)   PF 97 L/min   BMI 42.51 kg/m     Wt Readings from Last 3 Encounters:  08/31/20 240 lb (108.9 kg)  08/21/20 240 lb 12.8 oz (109.2 kg)  06/25/20 237 lb (107.5 kg)     GEN: Obese BMI exceeds 40 well nourished, well developed in no acute distress HEENT: Normal NECK: No JVD; No carotid bruits LYMPHATICS: No lymphadenopathy CARDIAC: RRR, no murmurs, rubs, gallops RESPIRATORY:  Clear to auscultation without rales, wheezing or rhonchi  ABDOMEN: Soft, non-tender, non-distended MUSCULOSKELETAL:  No edema; No deformity  SKIN: Warm and dry NEUROLOGIC:  Alert and oriented x 3 PSYCHIATRIC:  Normal affect     Signed, Shirlee More, MD  08/31/2020 4:14 PM    [facility]

## 2020-08-31 ENCOUNTER — Ambulatory Visit (INDEPENDENT_AMBULATORY_CARE_PROVIDER_SITE_OTHER): Payer: BC Managed Care – PPO

## 2020-08-31 ENCOUNTER — Other Ambulatory Visit: Payer: Self-pay

## 2020-08-31 ENCOUNTER — Ambulatory Visit: Payer: BC Managed Care – PPO | Admitting: Cardiology

## 2020-08-31 ENCOUNTER — Encounter: Payer: Self-pay | Admitting: Cardiology

## 2020-08-31 VITALS — BP 106/70 | HR 76 | Ht 63.0 in | Wt 240.0 lb

## 2020-08-31 DIAGNOSIS — Z86718 Personal history of other venous thrombosis and embolism: Secondary | ICD-10-CM

## 2020-08-31 DIAGNOSIS — I2510 Atherosclerotic heart disease of native coronary artery without angina pectoris: Secondary | ICD-10-CM

## 2020-08-31 DIAGNOSIS — R0602 Shortness of breath: Secondary | ICD-10-CM

## 2020-08-31 DIAGNOSIS — R079 Chest pain, unspecified: Secondary | ICD-10-CM | POA: Diagnosis not present

## 2020-08-31 DIAGNOSIS — I480 Paroxysmal atrial fibrillation: Secondary | ICD-10-CM

## 2020-08-31 MED ORDER — METOPROLOL TARTRATE 100 MG PO TABS: 100.0000 mg | ORAL_TABLET | Freq: Once | ORAL | 0 refills | Status: DC

## 2020-08-31 MED ORDER — FUROSEMIDE 40 MG PO TABS: 40.0000 mg | ORAL_TABLET | Freq: Every day | ORAL | 3 refills | Status: DC

## 2020-08-31 NOTE — Patient Instructions (Signed)
Medication Instructions:  Your physician has recommended you make the following change in your medication:  START: Furosemide 40 mg take one tablet by mouth daily.  *If you need a refill on your cardiac medications before your next appointment, please call your pharmacy*   Lab Work: Your physician recommends that you return for lab work in: TODAY BMP, CBC, ProBNP If you have labs (blood work) drawn today and your tests are completely normal, you will receive your results only by: Marland Kitchen MyChart Message (if you have MyChart) OR . A paper copy in the mail If you have any lab test that is abnormal or we need to change your treatment, we will call you to review the results.   Testing/Procedures: A zio monitor was ordered today. It will remain on for 7 days. You will then return monitor and event diary in provided box. It takes 1-2 weeks for report to be downloaded and returned to [place]. We will call you with the results. If monitor falls off or has orange flashing light, please call Zio for further instructions.   Your cardiac CT will be scheduled at the below location:   [facility] [address] [place], [place] [address] [phone]  If scheduled at [facility], please arrive at the [facility] main entrance (entrance A) of [facility] 30 minutes prior to test start time. Proceed to the [facility] Radiology Department (first floor) to check-in and test prep.  Please follow these instructions carefully (unless otherwise directed):  On the Night Before the Test: . Be sure to Drink plenty of water. . Do not consume any caffeinated/decaffeinated beverages or chocolate 12 hours prior to your test. . Do not take any antihistamines 12 hours prior to your test.  On the Day of the Test: . Drink plenty of water until 1 hour prior to the test. . Do not eat any food 4 hours prior to the test. . You may take your regular medications prior to the test.  . Take  metoprolol (Lopressor) two hours prior to test. . HOLD Furosemide morning of the test. . FEMALES- please wear underwire-free bra if available      After the Test: . Drink plenty of water. . After receiving IV contrast, you may experience a mild flushed feeling. This is normal. . On occasion, you may experience a mild rash up to 24 hours after the test. This is not dangerous. If this occurs, you can take Benadryl 25 mg and increase your fluid intake. . If you experience trouble breathing, this can be serious. If it is severe call 911 IMMEDIATELY. If it is mild, please call our office. . If you take any of these medications: Glipizide/Metformin, Avandament, Glucavance, please do not take 48 hours after completing test unless otherwise instructed.   Once we have confirmed authorization from your insurance company, we will call you to set up a date and time for your test. Based on how quickly your insurance processes prior authorizations requests, please allow up to 4 weeks to be contacted for scheduling your Cardiac CT appointment. Be advised that routine Cardiac CT appointments could be scheduled as many as 8 weeks after your provider has ordered it.  For non-scheduling related questions, please contact the cardiac imaging nurse navigator should you have any questions/concerns: Marchia Bond, Cardiac Imaging Nurse Navigator Gordy Clement, Cardiac Imaging Nurse Navigator [facility] Direct Office Dial: [phone]   For scheduling needs, including cancellations and rescheduling,  please call [place], [phone].     Follow-Up: At [facility], you and your health needs are our priority.  As part of our continuing mission to provide you with exceptional heart care, we have created designated Provider Care Teams.  These Care Teams include your primary Cardiologist (physician) and Advanced Practice Providers (APPs -  Physician Assistants and Nurse Practitioners)  who all work together to provide you with the care you need, when you need it.  We recommend signing up for the patient portal called "MyChart".  Sign up information is provided on this After Visit Summary.  MyChart is used to connect with patients for Virtual Visits (Telemedicine).  Patients are able to view lab/test results, encounter notes, upcoming appointments, etc.  Non-urgent messages can be sent to your provider as well.   To learn more about what you can do with MyChart, go to [email or web address].    Your next appointment:   6 week(s)  The format for your next appointment:   In Person  Provider:   Shirlee More, MD   Other Instructions

## 2020-09-01 ENCOUNTER — Telehealth: Payer: Self-pay

## 2020-09-01 DIAGNOSIS — R0600 Dyspnea, unspecified: Secondary | ICD-10-CM

## 2020-09-01 DIAGNOSIS — R0609 Other forms of dyspnea: Secondary | ICD-10-CM

## 2020-09-01 LAB — BASIC METABOLIC PANEL
BUN/Creatinine Ratio: 20 (ref 12–28)
BUN: 14 mg/dL (ref 8–27)
CO2: 26 mmol/L (ref 20–29)
Calcium: 9.3 mg/dL (ref 8.7–10.3)
Chloride: 99 mmol/L (ref 96–106)
Creatinine, Ser: 0.71 mg/dL (ref 0.57–1.00)
Glucose: 75 mg/dL (ref 65–99)
Potassium: 3.8 mmol/L (ref 3.5–5.2)
Sodium: 140 mmol/L (ref 134–144)
eGFR: 97 mL/min/{1.73_m2} (ref >59)

## 2020-09-01 LAB — CBC
Hematocrit: 40.5 % (ref 34.0–46.6)
Hemoglobin: 13.9 g/dL (ref 11.1–15.9)
MCH: 32.5 pg (ref 26.6–33.0)
MCHC: 34.3 g/dL (ref 31.5–35.7)
MCV: 95 fL (ref 79–97)
Platelets: 248 10*3/uL (ref 150–450)
RBC: 4.28 x10E6/uL (ref 3.77–5.28)
RDW: 12.8 % (ref 11.7–15.4)
WBC: 6.5 10*3/uL (ref 3.4–10.8)

## 2020-09-01 LAB — PRO B NATRIURETIC PEPTIDE: NT-Pro BNP: 259 pg/mL (ref 0–287)

## 2020-09-01 MED ORDER — POTASSIUM CHLORIDE ER 10 MEQ PO TBCR: 10.0000 meq | EXTENDED_RELEASE_TABLET | Freq: Every day | ORAL | 3 refills | Status: DC

## 2020-09-01 MED ORDER — TORSEMIDE 20 MG PO TABS: 20.0000 mg | ORAL_TABLET | Freq: Two times a day (BID) | ORAL | 3 refills | Status: DC

## 2020-09-01 NOTE — Telephone Encounter (Signed)
-----   Message from Richardo Priest, MD sent at 09/01/2020  7:57 AM EDT ----- Stable lab results  Although her BNP is not elevated I reviewed her echocardiogram at [facility] and does show findings of elevated pressures or heart failure.  Discontinue furosemide I like to see her take torsemide 20 mg twice daily and potassium 10 mill equivalents per day.  Weigh daily check blood pressure 2 weeks recheck BMP and bring those numbers to the office with her

## 2020-09-01 NOTE — Telephone Encounter (Signed)
Left message on patients voicemail to please return our call.   

## 2020-09-01 NOTE — Telephone Encounter (Signed)
Spoke with patient regarding results and recommendation.  Patient verbalizes understanding and is agreeable to plan of care. Advised patient to call back with any issues or concerns.  

## 2020-09-01 NOTE — Telephone Encounter (Signed)
-----   Message from Richardo Priest, MD sent at 09/01/2020  7:57 AM EDT ----- Stable lab results  Although her BNP is not elevated I reviewed her echocardiogram at [facility] and does show findings of elevated pressures or heart failure.  Discontinue furosemide I like to see her take torsemide 20 mg twice daily and potassium 10 mill equivalents per day.  Weigh daily check blood pressure 2 weeks recheck BMP and bring those numbers to the office with her

## 2020-09-07 ENCOUNTER — Telehealth (HOSPITAL_COMMUNITY): Payer: Self-pay | Admitting: Emergency Medicine

## 2020-09-07 DIAGNOSIS — I440 Atrioventricular block, first degree: Secondary | ICD-10-CM

## 2020-09-07 DIAGNOSIS — I480 Paroxysmal atrial fibrillation: Secondary | ICD-10-CM | POA: Diagnosis not present

## 2020-09-07 DIAGNOSIS — R0602 Shortness of breath: Secondary | ICD-10-CM

## 2020-09-07 DIAGNOSIS — Z86718 Personal history of other venous thrombosis and embolism: Secondary | ICD-10-CM

## 2020-09-07 NOTE — Telephone Encounter (Signed)
Pt returning phone call regarding upcoming cardiac imaging study; pt verbalizes understanding of appt date/time, parking situation and where to check in, pre-test NPO status and medications ordered, and verified current allergies; name and call back number provided for further questions should they arise Marchia Bond RN Navigator Cardiac Imaging [facility] [phone] office [phone] cell   100mg  metoprolol tart 2 hr prior to scan Holding torsemide/lasix and allergy meds

## 2020-09-07 NOTE — Telephone Encounter (Signed)
Attempted to call patient regarding upcoming cardiac CT appointment. °Left message on voicemail with name and callback number °Chassidy Layson RN Navigator Cardiac Imaging °[facility] °[phone] Office °[phone] Cell ° °

## 2020-09-08 ENCOUNTER — Other Ambulatory Visit: Payer: Self-pay

## 2020-09-08 ENCOUNTER — Ambulatory Visit (HOSPITAL_COMMUNITY): Payer: BC Managed Care – PPO

## 2020-09-08 ENCOUNTER — Ambulatory Visit (HOSPITAL_COMMUNITY)
Admission: RE | Admit: 2020-09-08 | Discharge: 2020-09-08 | Disposition: A | Discharge status: Home / Self Care | Payer: BC Managed Care – PPO | Source: Ambulatory Visit | Attending: Cardiology | Admitting: Cardiology

## 2020-09-08 DIAGNOSIS — I480 Paroxysmal atrial fibrillation: Secondary | ICD-10-CM | POA: Insufficient documentation

## 2020-09-08 DIAGNOSIS — R0602 Shortness of breath: Secondary | ICD-10-CM | POA: Diagnosis present

## 2020-09-08 DIAGNOSIS — I2510 Atherosclerotic heart disease of native coronary artery without angina pectoris: Secondary | ICD-10-CM

## 2020-09-08 DIAGNOSIS — Z86718 Personal history of other venous thrombosis and embolism: Secondary | ICD-10-CM | POA: Diagnosis present

## 2020-09-08 MED ORDER — IOHEXOL 350 MG/ML SOLN
100.0000 mL | Freq: Once | INTRAVENOUS | Status: AC | PRN
  Administered 2020-09-08: 100 mL via INTRAVENOUS

## 2020-09-08 MED ORDER — NITROGLYCERIN 0.4 MG SL SUBL
0.8000 mg | SUBLINGUAL_TABLET | Freq: Once | SUBLINGUAL | Status: AC
  Administered 2020-09-08: 0.8 mg via SUBLINGUAL

## 2020-09-08 MED ORDER — NITROGLYCERIN 0.4 MG SL SUBL
SUBLINGUAL_TABLET | SUBLINGUAL | Status: AC
  Filled 2020-09-08: qty 2

## 2020-09-08 NOTE — Progress Notes (Signed)
2 IV stick attempts. Right AC extravasation in CT, normal saline only. Pressure bandage, heat pack and elevation.

## 2020-09-09 ENCOUNTER — Telehealth: Payer: Self-pay

## 2020-09-09 NOTE — Telephone Encounter (Signed)
-----   Message from Richardo Priest, MD sent at 09/08/2020  5:44 PM EDT ----- Regarding: FW: Overall good   Lungs normal  Mild CAD one vessel  No severe CAD  I hope she is improving with diuretic ----- Message ----- From: Interface, Rad Results In Sent: 09/08/2020   4:12 PM EDT To: Richardo Priest, MD

## 2020-09-09 NOTE — Telephone Encounter (Signed)
Spoke with patient regarding results and recommendation.  Patient verbalizes understanding and is agreeable to plan of care. Advised patient to call back with any issues or concerns.  

## 2020-09-22 ENCOUNTER — Other Ambulatory Visit: Payer: Self-pay

## 2020-09-22 DIAGNOSIS — R0609 Other forms of dyspnea: Secondary | ICD-10-CM

## 2020-09-23 ENCOUNTER — Other Ambulatory Visit: Payer: Self-pay

## 2020-09-23 ENCOUNTER — Telehealth: Payer: Self-pay

## 2020-09-23 ENCOUNTER — Ambulatory Visit (INDEPENDENT_AMBULATORY_CARE_PROVIDER_SITE_OTHER): Payer: BC Managed Care – PPO | Admitting: Pulmonary Disease

## 2020-09-23 ENCOUNTER — Encounter: Payer: Self-pay | Admitting: Pulmonary Disease

## 2020-09-23 VITALS — BP 112/84 | HR 80 | Temp 98.1°F | Ht 63.0 in | Wt 237.8 lb

## 2020-09-23 DIAGNOSIS — E669 Obesity, unspecified: Secondary | ICD-10-CM

## 2020-09-23 DIAGNOSIS — G4733 Obstructive sleep apnea (adult) (pediatric): Secondary | ICD-10-CM | POA: Diagnosis not present

## 2020-09-23 DIAGNOSIS — Z8616 Personal history of COVID-19: Secondary | ICD-10-CM

## 2020-09-23 DIAGNOSIS — G4730 Sleep apnea, unspecified: Secondary | ICD-10-CM

## 2020-09-23 DIAGNOSIS — G4734 Idiopathic sleep related nonobstructive alveolar hypoventilation: Secondary | ICD-10-CM | POA: Diagnosis not present

## 2020-09-23 LAB — BASIC METABOLIC PANEL
BUN/Creatinine Ratio: 22 (ref 12–28)
BUN: 15 mg/dL (ref 8–27)
CO2: 27 mmol/L (ref 20–29)
Calcium: 9.4 mg/dL (ref 8.7–10.3)
Chloride: 98 mmol/L (ref 96–106)
Creatinine, Ser: 0.69 mg/dL (ref 0.57–1.00)
Glucose: 71 mg/dL (ref 65–99)
Potassium: 4.2 mmol/L (ref 3.5–5.2)
Sodium: 140 mmol/L (ref 134–144)
eGFR: 99 mL/min/{1.73_m2} (ref >59)

## 2020-09-23 NOTE — Progress Notes (Signed)
[facility]  Chief Complaint  Patient presents with   Consult    Sleep study back in the Feb/March.  Wants a 2nd opinion. Hx of covid in Oct 27th.  Sob since.    Constitutional:  BP 112/84 (BP Location: Right Arm, Patient Position: Sitting, Cuff Size: Normal)   Pulse 80   Temp 98.1 F (36.7 C) (Oral)   Ht 5\' 3"  (1.6 m)   Wt 237 lb 12.8 oz (107.9 kg)   SpO2 98%   BMI 42.12 kg/m   Past Medical History:  GERD, Erythema multiforme, Hypothyroidism, DVT, Depression, Lichen planus, OA, Overactive bladder, RLS, Spinal stenosis, COVID 19 infection November 2021  Past Surgical History:  She  has a past surgical history that includes Salivary gland surgery (01/17/2017); bladder tack; fibroid tumor; and Laminectomy (02/2013).  Brief Summary:  Darlene Lawson is a 60 y.o. female former smoker with sleep apnea.      Subjective:   She is here with her husband.  Was seen by Dr. Melvyn Novas recently.  Previously seen by Dr. Cassell Clement in [place].  She had COVID in November.  Started on oxygen then.  Now down to using oxygen only at night.  Still gets winded with activity, but slowly improving.  Had sleep study done with Dr. Cassell Clement in [place].  Results not available.  She isn't sure if she is supposed to get CPAP or just continue oxygen at night.  Physical Exam:   Appearance - well kempt   ENMT - no sinus tenderness, no oral exudate, no LAN, Mallampati 3 airway, no stridor  Respiratory - equal breath sounds bilaterally, no wheezing or rales  CV - s1s2 regular rate and rhythm, no murmurs  Ext - no clubbing, no edema  Skin - no rashes  Psych - normal mood and affect   Pulmonary testing:    Chest Imaging:    Sleep Tests:    Cardiac Tests:    Social History:  She  reports that she quit smoking about 29 years ago. Her smoking use included cigarettes. She has a 30.00 pack-year smoking history. She has never used smokeless tobacco. She  reports that she does not drink alcohol and does not use drugs.  Family History:  Her family history includes COPD in her father; Diabetes in her brother, father, mother, and sister; Heart disease in her mother; Lung cancer in her father; Stroke in her father.     Assessment/Plan:   Obstructive sleep apnea. - asked her to get medical records from [place] - after I review her records will determine if she needs to proceed with CPAP set up  Dyspnea on exertion. - after COVID 19 infection in November 2021 - she will be starting PT set up through her PCP - certain recent weight gain can contribute to sense of dyspnea on exertion  Nocturnal hypoxia. - after COVID 19 infection - after review of her sleep study will determine if she should continue oxygen at night or might be better served transitioning to CPAP  Oral lichen planus. - on MTX - followed by Dr. Saundra Shelling with [facility] dermatology  Time Spent Involved in Patient Care on Day of Examination:  36 minutes  Follow up:   Patient Instructions  Please get the following medical records from Dr. Maxie Barb office:  Most recent office note, lab tests, sleep studies, chest xray and/or CT chest, and pulmonary function test  Will call you after reviewing records from [place]  Follow up  in 3 months  Medication List:   Allergies as of 09/23/2020       Reactions   Penicillins Hives   Prednisone Other (See Comments)   Contraindicated while on MTX per Dr. Sharol Roussel (dermatologist)   Sulfa Antibiotics Rash        Medication List        Accurate as of September 23, 2020  5:19 PM. If you have any questions, ask your nurse or doctor.          STOP taking these medications    Xarelto 20 MG Tabs tablet Generic drug: rivaroxaban Stopped by: Chesley Mires, MD       TAKE these medications    ascorbic acid 500 MG tablet Commonly known as: VITAMIN C Take 500 mg by mouth daily.   Biotin 5 MG Tbdp Take 5 mg by mouth daily.    Calcium Carb-Cholecalciferol 600-400 MG-UNIT Tabs Take 1 tablet by mouth daily.   calcium carbonate 1500 (600 Ca) MG Tabs tablet Commonly known as: OSCAL Take 600 mg of elemental calcium by mouth daily with breakfast.   Cholecalciferol 50 MCG (2000 UT) Caps Take 2,000 Units by mouth daily.   clobetasol cream 0.05 % Commonly known as: TEMOVATE Apply 1 application topically 2 (two) times daily as needed (Rash).   clorazepate 3.75 MG tablet Commonly known as: TRANXENE Take 3.75 mg by mouth at bedtime as needed for anxiety.   clotrimazole 10 MG troche Commonly known as: MYCELEX Take 10 mg by mouth daily as needed (Mouth sore).   cycloSPORINE 100 MG capsule Commonly known as: SANDIMMUNE Take 100 mg by mouth daily as needed (Breakout).   esomeprazole 20 MG capsule Commonly known as: NEXIUM Take 20 mg by mouth daily at 12 noon.   finasteride 5 MG tablet Commonly known as: PROSCAR Take 5 mg by mouth daily.   fluticasone 50 MCG/ACT nasal spray Commonly known as: FLONASE Place 2 sprays into both nostrils daily as needed for allergies or rhinitis.   folic acid 1 MG tablet Commonly known as: FOLVITE Take 1 mg by mouth daily.   Krill Oil Ultra Strength 1500 MG Caps Take 1 capsule by mouth daily.   levothyroxine 175 MCG tablet Commonly known as: SYNTHROID Take 200 mcg by mouth daily before breakfast.   loratadine-pseudoephedrine 10-240 MG 24 hr tablet Commonly known as: CLARITIN-D 24-hour Take 1 tablet by mouth daily as needed for allergies.   Magnesium 250 MG Tabs Take 250 mg by mouth 2 (two) times daily.   methotrexate 2.5 MG tablet Commonly known as: RHEUMATREX Take 2.5 mg by mouth once a week. 4 tablets weekly.   metoprolol tartrate 100 MG tablet Commonly known as: LOPRESSOR Take 1 tablet (100 mg total) by mouth once for 1 dose. Take two hours prior to your cardiac CT   metroNIDAZOLE 1 % gel Commonly known as: METROGEL Apply 1 application topically daily.    Myrbetriq 50 MG Tb24 tablet Generic drug: mirabegron ER Take 50 mg by mouth daily.   potassium chloride 10 MEQ tablet Commonly known as: KLOR-CON Take 1 tablet (10 mEq total) by mouth daily.   tacrolimus 1 MG capsule Commonly known as: PROGRAF Take 1 mg by mouth every 12 (twelve) hours as needed (Breakout rash / mout sore).   torsemide 20 MG tablet Commonly known as: DEMADEX Take 1 tablet (20 mg total) by mouth 2 (two) times daily.   VESIcare 10 MG tablet Generic drug: solifenacin Take 10 mg by mouth at bedtime.  vitamin B-12 500 MCG tablet Commonly known as: CYANOCOBALAMIN Take 500 mcg by mouth daily.   vitamin E 180 MG (400 UNITS) capsule Take 400 Units by mouth daily.        Signature:  Chesley Mires, MD [place] Pager - [phone] 09/23/2020, 5:19 PM

## 2020-09-23 NOTE — Telephone Encounter (Signed)
Spoke with patient regarding results and recommendation.  Patient verbalizes understanding and is agreeable to plan of care. Advised patient to call back with any issues or concerns.  

## 2020-09-23 NOTE — Patient Instructions (Signed)
Please get the following medical records from Dr. Maxie Barb office:  Most recent office note, lab tests, sleep studies, chest xray and/or CT chest, and pulmonary function test  Will call you after reviewing records from [place]  Follow up in 3 months

## 2020-09-23 NOTE — Telephone Encounter (Signed)
-----   Message from Richardo Priest, MD sent at 09/23/2020  8:00 AM EDT ----- Normal or stable result  Normal kidney function and potassium  Her blood pressures are good I reviewed them from what she left in the office.

## 2020-10-01 ENCOUNTER — Ambulatory Visit (INDEPENDENT_AMBULATORY_CARE_PROVIDER_SITE_OTHER): Payer: BC Managed Care – PPO | Admitting: Podiatry

## 2020-10-01 ENCOUNTER — Other Ambulatory Visit: Payer: Self-pay

## 2020-10-01 DIAGNOSIS — M2042 Other hammer toe(s) (acquired), left foot: Secondary | ICD-10-CM | POA: Diagnosis not present

## 2020-10-01 DIAGNOSIS — M778 Other enthesopathies, not elsewhere classified: Secondary | ICD-10-CM

## 2020-10-01 DIAGNOSIS — M21622 Bunionette of left foot: Secondary | ICD-10-CM

## 2020-10-01 DIAGNOSIS — M216X2 Other acquired deformities of left foot: Secondary | ICD-10-CM

## 2020-10-01 DIAGNOSIS — Q828 Other specified congenital malformations of skin: Secondary | ICD-10-CM | POA: Diagnosis not present

## 2020-10-01 DIAGNOSIS — M19072 Primary osteoarthritis, left ankle and foot: Secondary | ICD-10-CM

## 2020-10-01 NOTE — Patient Instructions (Signed)

## 2020-10-05 ENCOUNTER — Encounter: Payer: Self-pay | Admitting: Podiatry

## 2020-10-05 NOTE — Progress Notes (Addendum)
Subjective: 60 year old female presents the office today for surgical consultation given left foot pain.  She states that she wants to go and proceed with surgery given ongoing nature of the pain.  She is tried inserts and she still getting used to them. Denies any systemic complaints such as fevers, chills, nausea, vomiting. No acute changes since last appointment, and no other complaints at this time.   Objective: AAO x3, NAD DP/PT pulses palpable bilaterally, CRT less than 3 seconds Hyperkeratotic tissue submetatarsal 2 this is where the majority discomfort is localized.  There is plantarflexion of the third digit.  Prominent metatarsal heads plantarly with atrophy of the fat pad.  Hammertoe deformities are present at digits 2 3 and 4.  Also tenderness on the tailor's bunion. No open lesions or pre-ulcerative lesions.  No pain with calf compression, swelling, warmth, erythema  Assessment: Chronic left foot pain  Plan: -All treatment options discussed with the patient including all alternatives, risks, complications.  -I again reviewed the x-rays with her and we discussed both conservative as well as surgical treatment options.  Given the ongoing nature of her symptoms despite conservative treatment she elects proceed with surgical intervention.  Discussed with her second tarsal shortening osteotomy, hammertoe repair of digits 2, 3 and 4 with replacement of the third MPJ and tailor's bunionectomy. -The incision placement as well as the postoperative course was discussed with the patient. I discussed risks of the surgery which include, but not limited to, infection, bleeding, pain, swelling, need for further surgery, delayed or nonhealing, painful or ugly scar, numbness or sensation changes, over/under correction, recurrence, transfer lesions, further deformity, hardware failure, DVT/PE, loss of toe/foot. Patient understands these risks and wishes to proceed with surgery. The surgical consent was  reviewed with the patient all 3 pages were signed. No promises or guarantees were given to the outcome of the procedure. All questions were answered to the best of my ability. Before the surgery the patient was encouraged to call the office if there is any further questions. The surgery will be performed at the [facility] on an outpatient basis. -Will obtain medical clearance.  -As a courtesy debrided the hyperkeratotic lesion submetatarsal 2 without any complications.  I did discuss with her that this is not a guarantee position of symptoms and long-term she still given the inserts work and arch supports. -Patient encouraged to call the office with any questions, concerns, change in symptoms.   *Knee scooter ordered for postoperative use issues not able to use crutches and will need to stay off of her foot.  Trula Slade DPM

## 2020-10-06 ENCOUNTER — Telehealth: Payer: Self-pay

## 2020-10-06 ENCOUNTER — Telehealth: Payer: Self-pay | Admitting: Pulmonary Disease

## 2020-10-06 NOTE — Telephone Encounter (Signed)
   [facility] Pre-operative Risk Assessment    Patient Name: Darlene Lawson  DOB: 06-07-1960 MRN: [id number]  HEARTCARE STAFF:  - IMPORTANT!!!!!! Under Visit Info/Reason for Call, type in Other and utilize the format Clearance MM/DD/YY or Clearance TBD. Do not use dashes or single digits. - Please review there is not already an duplicate clearance open for this procedure. - If request is for dental extraction, please clarify the # of teeth to be extracted. - If the patient is currently at the dentist's office, call Pre-Op Callback Staff (MA/nurse) to input urgent request.  - If the patient is not currently in the dentist office, please route to the Pre-Op pool.  Request for surgical clearance:  What type of surgery is being performed? Second tarsal shortening osteotomy, hammertoe repair of digits 2, 3 and 4 with replacement of the third MPJ and tailor's bunionectomy.  When is this surgery scheduled? 10/21/2020  What type of clearance is required (medical clearance vs. Pharmacy clearance to hold med vs. Both)? Medical   Are there any medications that need to be held prior to surgery and how long?  None noted  Practice name and name of physician performing surgery? [facility] /  Celesta Gentile, DPM  What is the office phone number?  [phone]   7.   What is the office fax number?  [phone]  8.   Anesthesia type (None, local, MAC, general) ? General   Toni Arthurs 10/06/2020, 3:40 PM  _________________________________________________________________   (provider comments below)

## 2020-10-06 NOTE — Telephone Encounter (Signed)
Fax received from Celesta Gentile DPM to perform a second tarsal shortening osteotomy hammer toe repair of digits 2. 3. And 4 with replacement of the third MPJ and tailor's bunionectomy on patient.  Patient needs surgery clearance. Patient was just seen 09/23/20. Office protocol is a risk assessment can be sent to surgeon if patient has been seen in 60 days or less.   Sending to Dr. Halford Chessman for risk assessment or recommendations if patient needs to be seen in office prior to surgical procedure.

## 2020-10-07 ENCOUNTER — Telehealth: Payer: Self-pay | Admitting: Cardiology

## 2020-10-07 NOTE — Telephone Encounter (Signed)
error 

## 2020-10-07 NOTE — Telephone Encounter (Signed)
Spoke with patient she states she was told yesterday she has to see Dr. Bettina Gavia before she can have her surgery. She states she has an appointment tin 8/1 and she will reschedule her surgery after she sees Dr. Bettina Gavia.

## 2020-10-07 NOTE — Telephone Encounter (Signed)
Patient called to say she was returning call. Please advise

## 2020-10-08 ENCOUNTER — Telehealth: Payer: Self-pay

## 2020-10-08 NOTE — Telephone Encounter (Signed)
We received an in basket message from her cardiologist stating that Darlene Lawson needs to make an appt to see him before he can clear her for sx. Darlene Lawson will make an appt with Dr. Bettina Gavia and then call us back to reschedule her surgery. Sx for 10/21/20 has been canceled. Notified Dr. Jacqualyn Posey and Caren Griffins with [facility].

## 2020-10-09 ENCOUNTER — Encounter: Payer: Self-pay | Admitting: Pulmonary Disease

## 2020-10-09 NOTE — Telephone Encounter (Signed)
I sent message to Dr. Jacqualyn Posey via Epic.  Please confirm his office has received this message.

## 2020-10-13 NOTE — Telephone Encounter (Signed)
Called spoke with Shelly at [facility]. She states patient surgery has been cancelled due to not being cleared by Cardiology until seen.  Darrick Penna states she will have Dr. Jacqualyn Posey forward Dr. Juanetta Gosling message to her and will keep on hand for when patient is seen by Cardiology and her surgery can be rescheduled.   Routing to VS as [employer]

## 2020-10-13 NOTE — Telephone Encounter (Signed)
Attempted to contact patient as part of preoperative cardiac protocol.  Left voice message at 10:12 AM on 10/13/2020.  Patient is call back.

## 2020-10-14 ENCOUNTER — Ambulatory Visit: Payer: BC Managed Care – PPO | Admitting: Cardiology

## 2020-10-16 NOTE — Telephone Encounter (Signed)
Left VM requesting call back 10/16/20 at 4:40PM Loel Dubonnet, NP

## 2020-10-16 NOTE — Telephone Encounter (Signed)
Note faxed to [facility] so they are aware the patient has been contacted x3 with no response.   Loel Dubonnet, NP

## 2020-10-19 ENCOUNTER — Encounter: Payer: Self-pay | Admitting: Pulmonary Disease

## 2020-10-25 NOTE — Progress Notes (Signed)
Cardiology Office Note:    Date:  10/26/2020   ID:  Darlene Lawson, DOB 04-06-1960, MRN [id number]  PCP:  Street, Sharon [place], MD  Cardiologist:  Shirlee More, MD    Referring MD: [address] [place] Mt, *    ASSESSMENT:    1. Chronic diastolic heart failure ([place])   2. Mild CAD   3. PAF (paroxysmal atrial fibrillation) ([place])   4. History of DVT (deep vein thrombosis)   5. Chronic anticoagulation    PLAN:    In order of problems listed above:  She is markedly improved no volume overload asymptomatic and continue her current loop diuretic for diastolic heart failure, her phenotype is a low proBNP level Mild CAD is common in a subset of people continue medical treatment including metoprolol initiate a low-dose statin and after foot surgery aspirin 81 mg daily No recurrence of atrial arrhythmia and no longer anticoagulated She has obstructive sleep apnea and will initiate CPAP therapy She is planned for reconstructive foot surgery under general anesthesia and she is optimized and ready for the procedure   Next appointment: 6 months   Medication Adjustments/Labs and Tests Ordered: Current medicines are reviewed at length with the patient today.  Concerns regarding medicines are outlined above.  No orders of the defined types were placed in this encounter.  Meds ordered this encounter  Medications   pravastatin (PRAVACHOL) 20 MG tablet    Sig: Take 1 tablet (20 mg total) by mouth every evening.    Dispense:  90 tablet    Refill:  3     Chief Complaint  Patient presents with   Clearance TBD    L foot Dr. Earleen Newport     Follow-up   Congestive Heart Failure     History of Present Illness:    Darlene Lawson is a 60 y.o. female with a hx of DVT January 2022 in the setting of COVID-19 infection and pneumonia, paroxysmal atrial fibrillation associated COVID-19 infection and pneumonia and coronary artery calcification last seen by me 08/31/2020 with shortness of breath and heart  failure.  I independently reviewed the echocardiogram done at [facility] 07/09/2020 which was consistent with diastolic heart failure I increased her diuretic therapy.  She has been seen by pulmonary [facility] for sleep apnea.  Compliance with diet, lifestyle and medications: Yes  She is markedly improved tomorrow no shortness of breath no edema orthopnea chest pain palpitation or syncope.  I reviewed her diagnosis of diastolic heart failure obstructive sleep apnea and mild nonobstructive CAD.  Start appropriate medical treatment no longer taking an anticoagulant but I did ask her to start lipid-lowering with a low intensity statin and follow-up lipid profile in 1 month  She underwent cardiac CTA reported 09/08/2020 with a total calcium score of 16 72nd percentile for age ejection fraction reported normal 58% mild CAD proximal LAD 25 to 49% trivial calcification of aortic valve.    She has a 7-day event monitor reported 09/11/2020 which showed rare ventricular and supraventricular ectopy and no episodes of atrial fibrillation.  Her proBNP level was relatively low not uncommon in a phenotype with obesity BMI 42.5.  Component Ref Range & Units 1 mo ago   NT-Pro BNP 0 - 287 pg/mL 259     Past Surgical History:  Procedure Laterality Date   bladder tack     fibroid tumor     LAMINECTOMY  02/2013   SALIVARY GLAND SURGERY  01/17/2017   removed    Current Medications: Current  Meds  Medication Sig   albuterol (VENTOLIN HFA) 108 (90 Base) MCG/ACT inhaler Inhale 2 puffs into the lungs every 6 (six) hours as needed for wheezing or shortness of breath.   ascorbic acid (VITAMIN C) 500 MG tablet Take 500 mg by mouth daily.   Biotin 5 MG TBDP Take 5 mg by mouth daily.   buPROPion (WELLBUTRIN XL) 300 MG 24 hr tablet Take 300 mg by mouth daily.   Calcium Carb-Cholecalciferol 600-400 MG-UNIT TABS Take 1 tablet by mouth daily.   calcium carbonate (OSCAL) 1500 (600 Ca) MG TABS tablet Take 600 mg of  elemental calcium by mouth daily with breakfast.   celecoxib (CELEBREX) 200 MG capsule Take 200 mg by mouth daily.   Cholecalciferol 50 MCG (2000 UT) CAPS Take 2,000 Units by mouth daily.   clobetasol cream (TEMOVATE) [id number] % Apply 1 application topically 2 (two) times daily as needed (Rash).   clorazepate (TRANXENE) 3.75 MG tablet Take 3.75 mg by mouth at bedtime as needed for anxiety.   clotrimazole (MYCELEX) 10 MG troche Take 10 mg by mouth daily as needed (Mouth sore).   cycloSPORINE (SANDIMMUNE) 100 MG capsule Take 100 mg by mouth daily as needed (Breakout).   esomeprazole (NEXIUM) 20 MG capsule Take 20 mg by mouth daily at 12 noon.   finasteride (PROSCAR) 5 MG tablet Take 5 mg by mouth daily.   fluticasone (FLONASE) 50 MCG/ACT nasal spray Place 2 sprays into both nostrils daily as needed for allergies or rhinitis.   folic acid (FOLVITE) 1 MG tablet Take 1 mg by mouth daily.   Krill Oil Ultra Strength 1500 MG CAPS Take 1 capsule by mouth daily.   levothyroxine (SYNTHROID, LEVOTHROID) 175 MCG tablet Take 200 mcg by mouth daily before breakfast.   loratadine-pseudoephedrine (CLARITIN-D 24-HOUR) 10-240 MG 24 hr tablet Take 1 tablet by mouth daily as needed for allergies.   Magnesium 250 MG TABS Take 250 mg by mouth 2 (two) times daily.   methotrexate (RHEUMATREX) 2.5 MG tablet Take 2.5 mg by mouth 3 (three) times a week. 4 tablets weekly.   metoprolol tartrate (LOPRESSOR) 100 MG tablet Take 1 tablet (100 mg total) by mouth once for 1 dose. Take two hours prior to your cardiac CT   metroNIDAZOLE (METROGEL) 1 % gel Apply 1 application topically daily.   potassium chloride (KLOR-CON) 10 MEQ tablet Take 1 tablet (10 mEq total) by mouth daily.   pravastatin (PRAVACHOL) 20 MG tablet Take 1 tablet (20 mg total) by mouth every evening.   tacrolimus (PROGRAF) 1 MG capsule Take 1 mg by mouth every 12 (twelve) hours as needed (Breakout rash / mout sore).   torsemide (DEMADEX) 20 MG tablet Take 1 tablet  (20 mg total) by mouth 2 (two) times daily.   VESICARE 10 MG tablet Take 10 mg by mouth at bedtime.   vitamin B-12 (CYANOCOBALAMIN) 500 MCG tablet Take 500 mcg by mouth daily.   vitamin E 180 MG (400 UNITS) capsule Take 400 Units by mouth daily.     Allergies:   Penicillins, Prednisone, and Sulfa antibiotics   Social History   Socioeconomic History   Marital status: Married    Spouse name: Not on file   Number of children: Not on file   Years of education: Not on file   Highest education level: Not on file  Occupational History   Not on file  Tobacco Use   Smoking status: Former    Packs/day: 1.50    Years: 20.00  Pack years: 30.00    Types: Cigarettes    Quit date: 34    Years since quitting: 29.6   Smokeless tobacco: Never  Substance and Sexual Activity   Alcohol use: No   Drug use: No   Sexual activity: Not on file  Other Topics Concern   Not on file  Social History Narrative   Not on file   Social Determinants of Health   Financial Resource Strain: Not on file  Food Insecurity: Not on file  Transportation Needs: Not on file  Physical Activity: Not on file  Stress: Not on file  Social Connections: Not on file     Family History: The patient's family history includes COPD in her father; Diabetes in her brother, father, mother, and sister; Heart disease in her mother; Lung cancer in her father; Stroke in her father. ROS:   Please see the history of present illness.    All other systems reviewed and are negative.  EKGs/Labs/Other Studies Reviewed:    The following studies were reviewed today:   EKG 09/01/2020 sinus rhythm normal Recent Labs: 08/31/2020: Hemoglobin 13.9; NT-Pro BNP 259; Platelets 248 09/22/2020: BUN 15; Creatinine, Ser 0.69; Potassium 4.2; Sodium 140  Recent Lipid Panel No results found for: CHOL, TRIG, HDL, CHOLHDL, VLDL, LDLCALC, LDLDIRECT  Physical Exam:    VS:  BP 104/60 (BP Location: Right Arm, Patient Position: Sitting)   Pulse  (!) 56   Ht 5' 3.5" (1.613 m)   Wt 241 lb 12.8 oz (109.7 kg)   SpO2 91%   BMI 42.16 kg/m     Wt Readings from Last 3 Encounters:  10/26/20 241 lb 12.8 oz (109.7 kg)  09/23/20 237 lb 12.8 oz (107.9 kg)  08/31/20 240 lb (108.9 kg)     GEN:  Well nourished, well developed in no acute distress HEENT: Normal NECK: No JVD; No carotid bruits LYMPHATICS: No lymphadenopathy CARDIAC: RRR, no murmurs, rubs, gallops RESPIRATORY:  Clear to auscultation without rales, wheezing or rhonchi  ABDOMEN: Soft, non-tender, non-distended MUSCULOSKELETAL:  No edema; No deformity  SKIN: Warm and dry NEUROLOGIC:  Alert and oriented x 3 PSYCHIATRIC:  Normal affect    Signed, Shirlee More, MD  10/26/2020 3:40 PM    [facility]

## 2020-10-26 ENCOUNTER — Other Ambulatory Visit: Payer: Self-pay

## 2020-10-26 ENCOUNTER — Encounter: Payer: Self-pay | Admitting: Cardiology

## 2020-10-26 ENCOUNTER — Encounter: Payer: BC Managed Care – PPO | Admitting: Podiatry

## 2020-10-26 ENCOUNTER — Ambulatory Visit (INDEPENDENT_AMBULATORY_CARE_PROVIDER_SITE_OTHER): Payer: BC Managed Care – PPO | Admitting: Cardiology

## 2020-10-26 VITALS — BP 104/60 | HR 56 | Ht 63.5 in | Wt 241.8 lb

## 2020-10-26 DIAGNOSIS — I480 Paroxysmal atrial fibrillation: Secondary | ICD-10-CM | POA: Diagnosis not present

## 2020-10-26 DIAGNOSIS — I5032 Chronic diastolic (congestive) heart failure: Secondary | ICD-10-CM | POA: Diagnosis not present

## 2020-10-26 DIAGNOSIS — I2510 Atherosclerotic heart disease of native coronary artery without angina pectoris: Secondary | ICD-10-CM | POA: Diagnosis not present

## 2020-10-26 DIAGNOSIS — Z86718 Personal history of other venous thrombosis and embolism: Secondary | ICD-10-CM

## 2020-10-26 DIAGNOSIS — Z7901 Long term (current) use of anticoagulants: Secondary | ICD-10-CM

## 2020-10-26 MED ORDER — PRAVASTATIN SODIUM 20 MG PO TABS: 20.0000 mg | ORAL_TABLET | Freq: Every evening | ORAL | 3 refills | Status: DC

## 2020-10-26 NOTE — Patient Instructions (Signed)
Medication Instructions:  Your physician has recommended you make the following change in your medication:  START: Pravastatin 20 mg take one tablet by mouth daily.  *If you need a refill on your cardiac medications before your next appointment, please call your pharmacy*   Lab Work: None If you have labs (blood work) drawn today and your tests are completely normal, you will receive your results only by: [place] (if you have MyChart) OR A paper copy in the mail If you have any lab test that is abnormal or we need to change your treatment, we will call you to review the results.   Testing/Procedures: None   Follow-Up: At [facility], you and your health needs are our priority.  As part of our continuing mission to provide you with exceptional heart care, we have created designated Provider Care Teams.  These Care Teams include your primary Cardiologist (physician) and Advanced Practice Providers (APPs -  Physician Assistants and Nurse Practitioners) who all work together to provide you with the care you need, when you need it.  We recommend signing up for the patient portal called "MyChart".  Sign up information is provided on this After Visit Summary.  MyChart is used to connect with patients for Virtual Visits (Telemedicine).  Patients are able to view lab/test results, encounter notes, upcoming appointments, etc.  Non-urgent messages can be sent to your provider as well.   To learn more about what you can do with MyChart, go to [email or web address].    Your next appointment:   6 month(s)  The format for your next appointment:   In Person  Provider:   Shirlee More, MD   Other Instructions

## 2020-10-31 ENCOUNTER — Other Ambulatory Visit: Payer: Self-pay | Admitting: Podiatry

## 2020-10-31 DIAGNOSIS — M19072 Primary osteoarthritis, left ankle and foot: Secondary | ICD-10-CM

## 2020-10-31 DIAGNOSIS — M21622 Bunionette of left foot: Secondary | ICD-10-CM

## 2020-10-31 DIAGNOSIS — M2042 Other hammer toe(s) (acquired), left foot: Secondary | ICD-10-CM

## 2020-10-31 DIAGNOSIS — Q828 Other specified congenital malformations of skin: Secondary | ICD-10-CM

## 2020-10-31 DIAGNOSIS — M216X2 Other acquired deformities of left foot: Secondary | ICD-10-CM

## 2020-10-31 NOTE — Progress Notes (Signed)
Knee scooter ordered 

## 2020-11-02 ENCOUNTER — Telehealth: Payer: Self-pay | Admitting: Urology

## 2020-11-02 NOTE — Telephone Encounter (Signed)
DOS - 11/18/20  TAILORS BUNIONECTOMY LEFT --- [id number] ARTHRIPASTY LESSER MPJ W/ TOTAL INPLANT 3RD LT -- [id number] HAMMERTOE REPAIR 2-4 LEFT --- [id number]   [employer] EFFECTIVE DATE - 03/28/20   PLAN DEDUCTIBLE - $5,500.00 w/ $0.00 REMAINING OUT OF POCKET - $6,850.00 W/ $0.00 REMAINING COINSURANCE - 0% COPAY - $0.00   SPOKE WITH TIFFANY WITH [employer] AND SHE STATED THAT FOR CPT CODES 60454, [id number] AND [id number] X'S  NO PRIOR AUTH REQUIRED.   REF # [id number]

## 2020-11-05 ENCOUNTER — Encounter: Payer: BC Managed Care – PPO | Admitting: Podiatry

## 2020-11-18 ENCOUNTER — Encounter: Payer: Self-pay | Admitting: Podiatry

## 2020-11-18 ENCOUNTER — Other Ambulatory Visit: Payer: Self-pay | Admitting: Podiatry

## 2020-11-18 DIAGNOSIS — M21622 Bunionette of left foot: Secondary | ICD-10-CM | POA: Diagnosis not present

## 2020-11-18 DIAGNOSIS — M216X2 Other acquired deformities of left foot: Secondary | ICD-10-CM | POA: Diagnosis not present

## 2020-11-18 DIAGNOSIS — M21542 Acquired clubfoot, left foot: Secondary | ICD-10-CM | POA: Diagnosis not present

## 2020-11-18 DIAGNOSIS — M2042 Other hammer toe(s) (acquired), left foot: Secondary | ICD-10-CM

## 2020-11-18 MED ORDER — OXYCODONE-ACETAMINOPHEN 5-325 MG PO TABS: 1.0000 | ORAL_TABLET | Freq: Four times a day (QID) | ORAL | 0 refills | Status: DC | PRN

## 2020-11-18 MED ORDER — CLINDAMYCIN HCL 300 MG PO CAPS: 300.0000 mg | ORAL_CAPSULE | Freq: Three times a day (TID) | ORAL | 0 refills | Status: DC

## 2020-11-18 MED ORDER — ONDANSETRON HCL 4 MG PO TABS: 4.0000 mg | ORAL_TABLET | Freq: Three times a day (TID) | ORAL | 0 refills | Status: DC | PRN

## 2020-11-18 NOTE — Progress Notes (Signed)
Postop medications sent 

## 2020-11-19 ENCOUNTER — Encounter: Payer: BC Managed Care – PPO | Admitting: Podiatry

## 2020-11-19 ENCOUNTER — Telehealth: Payer: Self-pay | Admitting: Podiatry

## 2020-11-19 ENCOUNTER — Other Ambulatory Visit: Payer: Self-pay | Admitting: Podiatry

## 2020-11-19 ENCOUNTER — Telehealth: Payer: Self-pay | Admitting: *Deleted

## 2020-11-19 MED ORDER — HYDROCODONE-ACETAMINOPHEN 5-325 MG PO TABS: 1.0000 | ORAL_TABLET | ORAL | 0 refills | Status: DC | PRN

## 2020-11-19 MED ORDER — RIVAROXABAN 10 MG PO TABS
10.0000 mg | ORAL_TABLET | Freq: Every day | ORAL | 0 refills | Status: DC
Start: 2020-11-19 — End: 2021-04-28

## 2020-11-19 NOTE — Telephone Encounter (Signed)
Patient calling to request an alternative pain medication (having itchy reaction to percocet). Please advise.

## 2020-11-19 NOTE — Telephone Encounter (Signed)
Patient is having a reaction to pain medicine prescribed 1 day ago,itching all over.Please advise.

## 2020-11-20 ENCOUNTER — Telehealth: Payer: Self-pay | Admitting: *Deleted

## 2020-11-20 NOTE — Telephone Encounter (Signed)
Called and left a message for the patient stating that I was calling to see how the patient was doing after a medicine reaction and to call the office.Darlene Lawson

## 2020-11-20 NOTE — Telephone Encounter (Signed)
Called and spoke with patient yesterday and stated that I was calling to see how the patient was doing and patient stated that she was itching bad and stopped the pain medicine and take benadryl if the patient had it and patient stated that she has already taken some and I stated that I would message Dr Jacqualyn Posey and we would try another type pf pain medicine and Dr Jacqualyn Posey spoke with the patient yesterday as well and I tried to call patient today to see how she was doing and I left a message on the voice mail. Lattie Haw

## 2020-11-24 ENCOUNTER — Ambulatory Visit (INDEPENDENT_AMBULATORY_CARE_PROVIDER_SITE_OTHER): Payer: BC Managed Care – PPO

## 2020-11-24 ENCOUNTER — Ambulatory Visit (INDEPENDENT_AMBULATORY_CARE_PROVIDER_SITE_OTHER): Payer: BC Managed Care – PPO | Admitting: Podiatry

## 2020-11-24 ENCOUNTER — Encounter: Payer: Self-pay | Admitting: Podiatry

## 2020-11-24 ENCOUNTER — Other Ambulatory Visit: Payer: Self-pay

## 2020-11-24 VITALS — BP 101/71 | HR 72 | Temp 97.5°F | Resp 16

## 2020-11-24 DIAGNOSIS — M216X2 Other acquired deformities of left foot: Secondary | ICD-10-CM

## 2020-11-24 DIAGNOSIS — M2042 Other hammer toe(s) (acquired), left foot: Secondary | ICD-10-CM

## 2020-11-24 DIAGNOSIS — M21622 Bunionette of left foot: Secondary | ICD-10-CM

## 2020-11-24 DIAGNOSIS — Z9889 Other specified postprocedural states: Secondary | ICD-10-CM

## 2020-11-24 DIAGNOSIS — Q828 Other specified congenital malformations of skin: Secondary | ICD-10-CM

## 2020-11-24 MED ORDER — HYDROCODONE-ACETAMINOPHEN 10-325 MG PO TABS: 1.0000 | ORAL_TABLET | Freq: Four times a day (QID) | ORAL | 0 refills | Status: AC | PRN

## 2020-12-01 NOTE — Progress Notes (Signed)
Subjective: Darlene Lawson is a 60 y.o. is seen today in office s/p left foot submetatarsal osteotomy, third metatarsal head resection with hammertoe repair, tailor's bunionectomy preformed on 11/18/2020.  She still having discomfort but seems to be improving.  She is in the cam boot been getting too much pressure on the surgical foot.  She has been off of her foot is much as possible using ice and knee scooter.  Denies any systemic complaints such as fevers, chills, nausea, vomiting. No calf pain, chest pain, shortness of breath.   Objective: General: No acute distress, AAOx3  DP/PT pulses palpable 2/4, CRT < 3 sec to all digits.  Protective sensation intact. Motor function intact.  LEFT foot: Incision is well coapted without any evidence of dehiscence with sutures intact. There is no surrounding erythema, ascending cellulitis, fluctuance, crepitus, malodor, drainage/purulence. There is mild edema around the surgical site. There is mild pain along the surgical site.  K wires intact without any drainage or pus or signs of infection.  The toes are in rectus position. No other areas of tenderness to bilateral lower extremities.  No other open lesions or pre-ulcerative lesions.  No pain with calf compression, swelling, warmth, erythema.   Assessment and Plan:  Status post left foot surgery, doing well with no complications   -Treatment options discussed including all alternatives, risks, and complications -X-rays obtained reviewed.  Hardware intact.  No sign of complicating factors at this time. -Small amount of antibiotic ointment was applied followed by dressing.  Keep dressing clean, dry, intact. -She is nonweightbearing.  She can be weightbearing with a heel if needed. -Finish Xarelto. -Ice/elevation -Pain medication as needed. -Monitor for any clinical signs or symptoms of infection and DVT/PE and directed to call the office immediately should any occur or go to the ER. -Follow-up as scheduled  or sooner if any problems arise. In the meantime, encouraged to call the office with any questions, concerns, change in symptoms.   Celesta Gentile, DPM

## 2020-12-03 ENCOUNTER — Ambulatory Visit (INDEPENDENT_AMBULATORY_CARE_PROVIDER_SITE_OTHER): Payer: BC Managed Care – PPO | Admitting: Podiatry

## 2020-12-03 ENCOUNTER — Other Ambulatory Visit: Payer: Self-pay

## 2020-12-03 DIAGNOSIS — M10071 Idiopathic gout, right ankle and foot: Secondary | ICD-10-CM

## 2020-12-03 DIAGNOSIS — M216X2 Other acquired deformities of left foot: Secondary | ICD-10-CM

## 2020-12-03 DIAGNOSIS — M2042 Other hammer toe(s) (acquired), left foot: Secondary | ICD-10-CM

## 2020-12-03 DIAGNOSIS — M21622 Bunionette of left foot: Secondary | ICD-10-CM

## 2020-12-03 DIAGNOSIS — Q828 Other specified congenital malformations of skin: Secondary | ICD-10-CM

## 2020-12-03 MED ORDER — COLCHICINE 0.6 MG PO TABS: 0.6000 mg | ORAL_TABLET | Freq: Every day | ORAL | 0 refills | Status: DC

## 2020-12-03 NOTE — Patient Instructions (Signed)
Colchicine Capsules What is this medication? COLCHICINE (KOL chi seen) prevents gout attacks. It works by decreasing inflammation and reducing the buildup of uric acid in your joints. This medicine may be used for other purposes; ask your health care provider or pharmacist if you have questions. COMMON BRAND NAME(S): MITIGARE What should I tell my care team before I take this medication? They need to know if you have any of these conditions: Kidney disease Liver disease An unusual or allergic reaction to colchicine, other medications, foods, dyes, or preservatives Pregnant or trying to get pregnant Breast-feeding How should I use this medication? Take this medication by mouth with water. Follow the directions on the prescription label. You can take it with or without food. If it upsets your stomach, take it with food. Do not take your medication more often than directed. A special MedGuide will be given to you by the pharmacist with each prescription and refill. Be sure to read this information carefully each time. Talk to your care team about the use of this medication in children. Special care may be needed. Patients over 62 years old may have a stronger reaction and need a smaller dose. Overdosage: If you think you have taken too much of this medicine contact a poison control center or emergency room at once. NOTE: This medicine is only for you. Do not share this medicine with others. What if I miss a dose? If you miss a dose, take it as soon as you can. If it is almost time for your next dose, take only that dose. Do not take double or extra doses. What may interact with this medication? Do not take this medication with any of the following: Certain antivirals for HIV or hepatitis This medication may also interact with the following: Certain antibiotics like erythromycin or clarithromycin Certain medications for blood pressure, heart disease, irregular heart beat Certain medications  for cholesterol like atorvastatin, lovastatin, and simvastatin Certain medications for fungal infections like ketoconazole, itraconazole, or posaconazole Cyclosporine Grapefruit or grapefruit juice This list may not describe all possible interactions. Give your health care provider a list of all the medicines, herbs, non-prescription drugs, or dietary supplements you use. Also tell them if you smoke, drink alcohol, or use illegal drugs. Some items may interact with your medicine. What should I watch for while using this medication? Visit your care team for regular checks on your progress. Tell your care team if your symptoms do not start to get better or if they get worse. You should make sure you get enough vitamin B12 while you are taking this medication. Discuss the foods you eat and the vitamins you take with your care team. This medication may increase your risk to bruise or bleed. Call you care team if you notice any unusual bleeding. What side effects may I notice from receiving this medication? Side effects that you should report to your care team as soon as possible: Allergic reactions-skin rash, itching, hives, swelling of the face, lips, tongue, or throat Infection-fever, chills, cough, sore throat, wounds that don't heal, pain or trouble when passing urine, general feeling of discomfort or being unwell Muscle injury-unusual weakness or fatigue, muscle pain, dark yellow or brown urine, decrease in the amount of urine Pain, tingling, or numbness in the hands or feet Unusual bleeding or bruising Side effects that usually do not require medical attention (report these to your care team if they continue or are bothersome): Diarrhea Nausea Vomiting This list may not describe all possible  side effects. Call your doctor for medical advice about side effects. You may report side effects to FDA at 1-800-FDA-1088. Where should I keep my medication? Keep out of the reach of children and  pets. Store at room temperature between 20 and 25 degrees C (68 and 77 degrees F). Protect from light and moisture. Keep the container tightly closed. Get rid of any unused medication after the expiration date. To get rid of medications that are no longer needed or have expired: Take the medication to a medication take-back program. Check with your pharmacy or law enforcement to find a location. If you cannot return the medication, check the label or package insert to see if the medication should be thrown out in the garbage or flushed down the toilet. If you are not sure, ask your care team. If it is safe to put it in the trash, take the medication out of the container. Mix the medication with cat litter, dirt, coffee grounds, or other unwanted substance. Seal the mixture in a bag or container. Put it in the trash. NOTE: This sheet is a summary. It may not cover all possible information. If you have questions about this medicine, talk to your doctor, pharmacist, or health care provider.  2022 Elsevier/Gold Standard (2020-07-22 14:42:57)

## 2020-12-08 NOTE — Progress Notes (Signed)
Subjective: Darlene Lawson is a 60 y.o. is seen today in office s/p left foot submetatarsal osteotomy, third metatarsal head resection with hammertoe repair, tailor's bunionectomy preformed on 11/18/2020.  States the pain is improving.  She has been nonweightbearing using a cam boot, knee scooter for the left side.  She also has a new concern today of possible gout to her right big toe which started suddenly this week when she woke up.  She has been using Voltaren gel on this.  She does not recall any injury to that side.  She has no swelling or redness of the first toe joint.  She has no fevers or chills.  She has no other concerns.  Objective: General: No acute distress, AAOx3  DP/PT pulses palpable 2/4, CRT < 3 sec to all digits.  Protective sensation intact. Motor function intact.  LEFT foot: Incision is well coapted without any evidence of dehiscence with sutures intact. There is no surrounding erythema, ascending cellulitis, fluctuance, crepitus, malodor, drainage/purulence. There is mild however improved edema around the surgical site. There is mild pain along the surgical site.  K wires intact without any drainage or pus or signs of infection.  The toes are in rectus position. RIGHT foot: There is localized edema erythema to the first MPJ with tenderness with first MPJ range of motion.  There is no other areas of tenderness over swelling/redness. No other areas of tenderness to bilateral lower extremities.  No other open lesions or pre-ulcerative lesions.  No pain with calf compression, swelling, warmth, erythema.   Assessment and Plan:  Status post left foot surgery, doing well with no complications; right foot gout  -Treatment options discussed including all alternatives, risks, and complications -The left foot I removed a few sutures today without any complications.  Swelling is improving.  No signs of infection currently.  Small amount of antibiotic ointment was applied followed by  dressing.  She can leave dressing clean, dry, intact just see back next week and hopefully remove the remainder of the sutures.  Continue nonweightbearing, cam boot, ice elevation. -For the right foot we held off on x-rays as there is no injury but symptoms continue we will get x-rays.  Prescribed colchicine.  Hydration.  Trula Slade DPM

## 2020-12-10 ENCOUNTER — Ambulatory Visit (INDEPENDENT_AMBULATORY_CARE_PROVIDER_SITE_OTHER): Payer: BC Managed Care – PPO | Admitting: Podiatry

## 2020-12-10 ENCOUNTER — Other Ambulatory Visit: Payer: Self-pay

## 2020-12-10 DIAGNOSIS — M21622 Bunionette of left foot: Secondary | ICD-10-CM

## 2020-12-10 DIAGNOSIS — M2042 Other hammer toe(s) (acquired), left foot: Secondary | ICD-10-CM

## 2020-12-10 DIAGNOSIS — Z9889 Other specified postprocedural states: Secondary | ICD-10-CM

## 2020-12-10 DIAGNOSIS — M216X2 Other acquired deformities of left foot: Secondary | ICD-10-CM

## 2020-12-10 DIAGNOSIS — Q828 Other specified congenital malformations of skin: Secondary | ICD-10-CM

## 2020-12-15 NOTE — Progress Notes (Signed)
Subjective: Darlene Lawson is a 60 y.o. is seen today in office s/p left foot submetatarsal osteotomy, third metatarsal head resection with hammertoe repair, tailor's bunionectomy preformed on 11/18/2020.  She presents here for suture movable.  She not been taking any pain medication.  She has some burning into her heel but no other significant discomfort.  No recent injury or trauma.  She demonstrates to offload her foot is much as possible.    Objective: General: No acute distress, AAOx3  DP/PT pulses palpable 2/4, CRT < 3 sec to all digits.  Protective sensation intact. Motor function intact.  LEFT foot: Incision is well coapted without any evidence of dehiscence with sutures intact.  Small scab is present on surgical sites.  There is no surrounding erythema, ascending cellulitis.  No fluctuance or crepitation.  No malodor.  No obvious signs of infection.  Toes are rectus position with K wires intact with any drainage.  No other open lesions or pre-ulcerative lesions.  No pain with calf compression, swelling, warmth, erythema.   Assessment and Plan:  Status post left foot surgery, doing well with no complications  -Treatment options discussed including all alternatives, risks, and complications -Sutures removed on the left without any complications.  Dehiscence or signs of infection.  Discussed she can wash it with soap and water daily and dry thoroughly.  Apply a small amount of antibiotic ointment and a bandage. -Nonweightbearing cam boot -Continue to ice and elevate -Monitor for any clinical signs or symptoms of infection and directed to call the office immediately should any occur or go to the ER.  Trula Slade DPM

## 2020-12-17 ENCOUNTER — Encounter: Payer: BC Managed Care – PPO | Admitting: Podiatry

## 2020-12-22 ENCOUNTER — Ambulatory Visit (INDEPENDENT_AMBULATORY_CARE_PROVIDER_SITE_OTHER): Payer: BC Managed Care – PPO

## 2020-12-22 ENCOUNTER — Other Ambulatory Visit: Payer: Self-pay

## 2020-12-22 ENCOUNTER — Ambulatory Visit (INDEPENDENT_AMBULATORY_CARE_PROVIDER_SITE_OTHER): Payer: BC Managed Care – PPO | Admitting: Podiatry

## 2020-12-22 DIAGNOSIS — M216X2 Other acquired deformities of left foot: Secondary | ICD-10-CM

## 2020-12-22 DIAGNOSIS — Z9889 Other specified postprocedural states: Secondary | ICD-10-CM

## 2020-12-22 DIAGNOSIS — Q828 Other specified congenital malformations of skin: Secondary | ICD-10-CM

## 2020-12-22 DIAGNOSIS — M2042 Other hammer toe(s) (acquired), left foot: Secondary | ICD-10-CM

## 2020-12-22 NOTE — Progress Notes (Signed)
Subjective: Darlene Lawson is a 60 y.o. is seen today in office s/p left foot submetatarsal osteotomy, third metatarsal head resection with hammertoe repair, tailor's bunionectomy preformed on 11/18/2020.  States that she has been doing well.  She not taking significant pain medication.  She gets sharp pain to the side of her foot and not sure if that is coming from the boot but otherwise she has been doing well.  Denies any fevers or chills.  She has no other concerns.  Objective: General: No acute distress, AAOx3  DP/PT pulses palpable 2/4, CRT < 3 sec to all digits.  Protective sensation intact. Motor function intact.  LEFT foot: Incision is well coapted without any evidence of dehiscence.  On the dorsal aspect there is a scab present on the skin bridge but there is no open sore identified.  Upon cleaning this a lot of the scab skin came off and new healthy skin is present.  There is no drainage or any signs of infection.  The toes are rectus position.  K wire intact without any drainage or pus.  No significant pain on exam today. No other open lesions or pre-ulcerative lesions.  No pain with calf compression, swelling, warmth, erythema.   Assessment and Plan:  Status post left foot surgery, doing well with no complications  -Treatment options discussed including all alternatives, risks, and complications -X-rays obtained reviewed.  Hardware intact without any complicating factors noted today. -I cleaned the incisions and was able to remove a lot of loose scab, callus tissue.  Small amount of antibiotic ointment was applied followed by dressing.  Discussed she can wash foot with soap and water and dry thoroughly and apply a similar bandage. -Continue nonweightbearing, ice elevation.  I will see her back in about 1 week and at that point remove the K wires.  Return in about 1 week (around 12/29/2020) for x-ray; pin removal.  Trula Slade DPM   -Sutures removed on the left without any  complications.  Dehiscence or signs of infection.  Discussed she can wash it with soap and water daily and dry thoroughly.  Apply a small amount of antibiotic ointment and a bandage. -Nonweightbearing cam boot -Continue to ice and elevate -Monitor for any clinical signs or symptoms of infection and directed to call the office immediately should any occur or go to the ER.  Trula Slade DPM

## 2020-12-25 ENCOUNTER — Ambulatory Visit: Payer: BC Managed Care – PPO | Admitting: Pulmonary Disease

## 2021-01-05 ENCOUNTER — Ambulatory Visit (INDEPENDENT_AMBULATORY_CARE_PROVIDER_SITE_OTHER): Payer: BC Managed Care – PPO | Admitting: Podiatry

## 2021-01-05 ENCOUNTER — Ambulatory Visit (INDEPENDENT_AMBULATORY_CARE_PROVIDER_SITE_OTHER): Payer: BC Managed Care – PPO

## 2021-01-05 ENCOUNTER — Other Ambulatory Visit: Payer: Self-pay

## 2021-01-05 DIAGNOSIS — M216X2 Other acquired deformities of left foot: Secondary | ICD-10-CM

## 2021-01-05 DIAGNOSIS — M2042 Other hammer toe(s) (acquired), left foot: Secondary | ICD-10-CM

## 2021-01-05 DIAGNOSIS — Z9889 Other specified postprocedural states: Secondary | ICD-10-CM

## 2021-01-10 NOTE — Progress Notes (Signed)
Subjective: Darlene Lawson is a 60 y.o. is seen today in office s/p left foot submetatarsal osteotomy, third metatarsal head resection with hammertoe repair, tailor's bunionectomy preformed on 11/18/2020.  She presents to have the K wire removed.  She states that she has been doing well and not having significant pain.  She is on a cam boot and nonweightbearing.  No new concerns today.  No fevers or chills that she reports.  No other concerns.    Objective: General: No acute distress, AAOx3  DP/PT pulses palpable 2/4, CRT < 3 sec to all digits.  Protective sensation intact. Motor function intact.  LEFT foot: Incision is well coapted without any evidence of dehiscence.  Mild edema still present but appears to be improving.  No erythema or warmth.  There is no ascending cellulitis.  No drainage or pus.  The K wires are intact in the toes are sitting in rectus position. No pain with calf compression, swelling, warmth, erythema.   Assessment and Plan:  Status post left foot surgery, doing well with no complications  -Treatment options discussed including all alternatives, risks, and complications -X-rays obtained reviewed.  Hardware intact without any complicating factors noted today.  E lesion of the third metatarsal but clinically the toes are sitting in a rectus position. -I remove the K wires in toto without any complications.  Small amount of antibiotic ointment was applied.  She can start to shower.  Dry thoroughly.  Apply a small amount of antibiotic ointment as well.  She can slowly transition to partial weight-bear for full weightbearing in the cam boot. -Monitor for any clinical signs or symptoms of infection and directed to call the office immediately should any occur or go to the ER.  Trula Slade DPM

## 2021-01-19 ENCOUNTER — Other Ambulatory Visit: Payer: Self-pay

## 2021-01-19 ENCOUNTER — Ambulatory Visit (INDEPENDENT_AMBULATORY_CARE_PROVIDER_SITE_OTHER): Payer: BC Managed Care – PPO | Admitting: Podiatry

## 2021-01-19 DIAGNOSIS — Z9889 Other specified postprocedural states: Secondary | ICD-10-CM

## 2021-01-19 DIAGNOSIS — M2042 Other hammer toe(s) (acquired), left foot: Secondary | ICD-10-CM

## 2021-01-19 DIAGNOSIS — M216X2 Other acquired deformities of left foot: Secondary | ICD-10-CM

## 2021-01-22 NOTE — Progress Notes (Signed)
Subjective: Darlene Lawson is a 60 y.o. is seen today in office s/p left foot submetatarsal osteotomy, third metatarsal head resection with hammertoe repair, tailor's bunionectomy preformed on 11/18/2020.  States that she is doing better.  She is put some weight on her foot but not significant amount.  She still in the cam boot.  Occasional discomfort but intermittent.  No redness or warmth.  Occasional swelling.  She has no fevers or chills.  She has no other concerns.   Objective: General: No acute distress, AAOx3  DP/PT pulses palpable 2/4, CRT < 3 sec to all digits.  Protective sensation intact. Motor function intact.  LEFT foot: Incision is well coapted without any evidence of dehiscence and scars are well formed.  Clinically the toes are in rectus position.  There is minimal swelling to the surgical site.  There is noes reoccurrence of callus formation, skin lesion plantar aspect of the foot and there is no tenderness to this area.  No other areas of discomfort. No pain with calf compression, swelling, warmth, erythema.   Assessment and Plan:  Status post left foot surgery, doing well with no complications  -Treatment options discussed including all alternatives, risks, and complications -At this point on her strict transition to partial weightbearing in the cam boot and as she is able to tolerate this that she can transition to full weightbearing in the cam boot.  Continue ice and elevate.  I dispensed a splint to help hold the toes in a rectus position.  Return in about 2 weeks (around 02/02/2021).  X-ray next appointment  Trula Slade DPM

## 2021-02-02 ENCOUNTER — Encounter: Payer: Self-pay | Admitting: Podiatry

## 2021-02-02 ENCOUNTER — Ambulatory Visit (INDEPENDENT_AMBULATORY_CARE_PROVIDER_SITE_OTHER): Payer: BC Managed Care – PPO

## 2021-02-02 ENCOUNTER — Ambulatory Visit (INDEPENDENT_AMBULATORY_CARE_PROVIDER_SITE_OTHER): Payer: BC Managed Care – PPO | Admitting: Podiatry

## 2021-02-02 ENCOUNTER — Other Ambulatory Visit: Payer: Self-pay

## 2021-02-02 DIAGNOSIS — M2042 Other hammer toe(s) (acquired), left foot: Secondary | ICD-10-CM | POA: Diagnosis not present

## 2021-02-02 DIAGNOSIS — M216X2 Other acquired deformities of left foot: Secondary | ICD-10-CM

## 2021-02-02 DIAGNOSIS — Z9889 Other specified postprocedural states: Secondary | ICD-10-CM

## 2021-02-02 DIAGNOSIS — Q828 Other specified congenital malformations of skin: Secondary | ICD-10-CM

## 2021-02-05 NOTE — Progress Notes (Signed)
Subjective: Darlene Lawson is a 60 y.o. is seen today in office s/p left foot submetatarsal osteotomy, third metatarsal head resection with hammertoe repair, tailor's bunionectomy preformed on 11/18/2020.  States that she is having some swelling but denies any drainage or pus or any redness or warmth.  She still in the cam boot she is walking in the cam boot.  She does bring a regular shoe in today in hopes of trying to wear this.  No recent injury or fall or changes otherwise.  Denies any fevers or chills.  No other concerns.  Objective: General: No acute distress, AAOx3  DP/PT pulses palpable 2/4, CRT < 3 sec to all digits.  Protective sensation intact. Motor function intact.  LEFT foot: Incision is well coapted without any evidence of dehiscence and scars are well formed.  There is mild edema still present there is no erythema or warmth.  Clinically the toes are in its position for the most part.  The fourth toe some adductovarus noted.  Still slight tenderness mostly on the third MPJ area but no other specific areas of pinpoint tenderness. No pain with calf compression, swelling, warmth, erythema.   Assessment and Plan:  Status post left foot surgery, doing well with no complications  -Treatment options discussed including all alternatives, risks, and complications -X-rays obtained and reviewed.  Hardware intact without complicating factors.  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 -At this point clinically the toes are still rectus and she does not have any reoccurrence of the pain plantarly.  Discussed with her she can slowly transition back to regular shoe as tolerated.  She tried putting her shoe on today but still somewhat swollen and we held off on this.  Discussed compression, elevation probably postoperative edema discussed gradual transition back into regular shoe.  Continue to ice and elevate as well. -Long-term discussed that if the surgery would not to work  she would likely need to have a pan metatarsal head resection but hopefully we can avoid any further surgery. -Plans to get out of work and return to work on February 22, 2021.  X-ray next appointment   Trula Slade DPM

## 2021-02-23 ENCOUNTER — Ambulatory Visit (INDEPENDENT_AMBULATORY_CARE_PROVIDER_SITE_OTHER): Payer: BC Managed Care – PPO | Admitting: Podiatry

## 2021-02-23 ENCOUNTER — Ambulatory Visit (INDEPENDENT_AMBULATORY_CARE_PROVIDER_SITE_OTHER): Payer: BC Managed Care – PPO

## 2021-02-23 ENCOUNTER — Encounter: Payer: Self-pay | Admitting: Podiatry

## 2021-02-23 ENCOUNTER — Other Ambulatory Visit: Payer: Self-pay

## 2021-02-23 DIAGNOSIS — M21622 Bunionette of left foot: Secondary | ICD-10-CM

## 2021-02-23 DIAGNOSIS — Z9889 Other specified postprocedural states: Secondary | ICD-10-CM

## 2021-02-23 DIAGNOSIS — M2042 Other hammer toe(s) (acquired), left foot: Secondary | ICD-10-CM | POA: Diagnosis not present

## 2021-02-23 DIAGNOSIS — M216X2 Other acquired deformities of left foot: Secondary | ICD-10-CM

## 2021-02-26 NOTE — Progress Notes (Signed)
Subjective: Darlene Lawson is a 60 y.o. is seen today in office s/p left foot submetatarsal osteotomy, third metatarsal head resection with hammertoe repair, tailor's bunionectomy preformed on 11/18/2020.  She is back to wearing regular shoe.  She gets some numbness sensation to the toes.  No significant pain but she does not feel that she is ready to return back to work.  No recent injury or changes.  Swelling is continue to improve.  She has no other concerns today.  No fevers or chills.    Objective: General: No acute distress, AAOx3  DP/PT pulses palpable 2/4, CRT < 3 sec to all digits.  Protective sensation intact. Motor function intact.  LEFT foot: Incision is well coapted without any evidence of dehiscence and scars are well formed.  There is mild however improved edema still present there is no erythema or warmth.  Clinically the toes are in its position except for the fourth toe with adductovarus which was present but has not worsened.  There is no tender submetatarsal area where she was having tenderness prior to surgery.  Most of her symptoms were coming from the numbness to her toes.   Noted to be mildly hypertrophic and dystrophic discolored.  No drainage or pus to the toenail sites.  Asking for them to be trimmed. No pain with calf compression, swelling, warmth, erythema.   Assessment and Plan:  Status post left foot surgery, doing well with no complications  -Treatment options discussed including all alternatives, risks, and complications -X-rays obtained and reviewed.  Hardware intact without complicating factors.  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 -I placed a toe separator to the third and fourth toes to help prevent any further lateral deviation of the second and third digits.  She is already back to her regular shoe.  Continue with this and gradual increase in activity level.  Continue ice and elevate as well as compression of any postoperative  edema. -She is scheduled back to work on January 2.  I like for the right to work for 4 hours daily for 3 weeks before going back 6 hours which is her regular shifts.  Unfortunately she has to stand at work on hard surfaces all day and all think she is quite ready to do this. -Toenails were sharply debrided x10 without any complications or bleeding as a courtesy.  Trula Slade DPM

## 2021-03-23 ENCOUNTER — Other Ambulatory Visit: Payer: Self-pay

## 2021-03-23 ENCOUNTER — Ambulatory Visit (INDEPENDENT_AMBULATORY_CARE_PROVIDER_SITE_OTHER): Payer: BC Managed Care – PPO

## 2021-03-23 ENCOUNTER — Ambulatory Visit (INDEPENDENT_AMBULATORY_CARE_PROVIDER_SITE_OTHER): Payer: BC Managed Care – PPO | Admitting: Podiatry

## 2021-03-23 DIAGNOSIS — Z9889 Other specified postprocedural states: Secondary | ICD-10-CM

## 2021-03-23 DIAGNOSIS — M2042 Other hammer toe(s) (acquired), left foot: Secondary | ICD-10-CM

## 2021-03-23 DIAGNOSIS — M216X2 Other acquired deformities of left foot: Secondary | ICD-10-CM

## 2021-03-24 ENCOUNTER — Encounter: Payer: Self-pay | Admitting: Podiatry

## 2021-03-29 NOTE — Progress Notes (Signed)
Subjective: Darlene Lawson is a 61 y.o. is seen today in office s/p left foot submetatarsal osteotomy, third metatarsal head resection with hammertoe repair, tailor's bunionectomy preformed on 11/18/2020.  She is back to wearing regular shoe but she is still concerned about having to go back to work standing on her foot.  No new injuries or concerns otherwise.  No fevers or chills.    Objective: General: No acute distress, AAOx3  DP/PT pulses palpable 2/4, CRT < 3 sec to all digits.  Protective sensation intact. Motor function intact.  LEFT foot: Incision is well coapted without any evidence of dehiscence and scars are well formed.  There is still some mild residual edema but there is no significant erythema there is no warmth.  Incisions are all well-healed.  Mild diffuse discomfort noted but no specific area of pinpoint tenderness.  MMT 5/5. No pain with calf compression, swelling, warmth, erythema.   Assessment and Plan:  Status post left foot surgery, doing well with no complications  -Treatment options discussed including all alternatives, risks, and complications -X-rays obtained reviewed.  Hardware intact without complicating factors.  Creatinine is still present on the third MPJ with angulation noted.  Clinically the toe is sitting in a rectus position. -She is back to her regular shoe.  Continue with range of motion exercises and gradually increase activity level as tolerated.  She is scheduled to return back to work tomorrow extended to the March 30, 2020 for 4 hours for 3 weeks then full-time.  She is able to sit 15 minutes every hour.  Trula Slade DPM  -X-rays obtained and reviewed.  Hardware intact without complicating factors.  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 -I placed a toe separator to the third and fourth toes to help prevent any further lateral deviation of the second and third digits.  She is already back to her regular shoe.  Continue  with this and gradual increase in activity level.  Continue ice and elevate as well as compression of any postoperative edema. -She is scheduled back to work on January 2.  I like for the right to work for 4 hours daily for 3 weeks before going back 6 hours which is her regular shifts.  Unfortunately she has to stand at work on hard surfaces all day and all think she is quite ready to do this. -Toenails were sharply debrided x10 without any complications or bleeding as a courtesy.  Trula Slade DPM

## 2021-03-30 ENCOUNTER — Telehealth: Payer: Self-pay | Admitting: Podiatry

## 2021-03-30 NOTE — Telephone Encounter (Signed)
Called and left a message for the patient stating that I would be back in [place] tomorrow if patient needed to call me there tomorrow. Lattie Haw

## 2021-03-30 NOTE — Telephone Encounter (Signed)
I sent paperwork over to Disability on 03/24/2021, with restrictions for patient. I spoke with [name] this morning and she was wondering if you wanted her to return to work 4 hours for 3 weeks or to work 4 hours per day until she returns to see you. She was under the impression that she wouldn't return to full duty at work until after seeing you again. Please advise, patient would like to speak with you or Lattie Haw about this.

## 2021-04-20 ENCOUNTER — Encounter: Payer: Self-pay | Admitting: Podiatry

## 2021-04-20 ENCOUNTER — Other Ambulatory Visit: Payer: Self-pay

## 2021-04-20 ENCOUNTER — Ambulatory Visit: Payer: BC Managed Care – PPO | Admitting: Podiatry

## 2021-04-20 DIAGNOSIS — M216X2 Other acquired deformities of left foot: Secondary | ICD-10-CM | POA: Diagnosis not present

## 2021-04-20 DIAGNOSIS — M2042 Other hammer toe(s) (acquired), left foot: Secondary | ICD-10-CM | POA: Diagnosis not present

## 2021-04-26 NOTE — Progress Notes (Signed)
Subjective: Darlene Lawson is a 61 y.o. is seen today in office s/p left foot submetatarsal osteotomy, third metatarsal head resection with hammertoe repair, tailor's bunionectomy preformed on 11/18/2020.  She has been back to work working 4 hours a day and she states that she cannot work past this and even at that time, she comes home and has discomfort and some swelling to her foot.  She denies any recent injury or changes.  She is wearing a shoe full-time.  Objective: General: No acute distress, AAOx3  DP/PT pulses palpable 2/4, CRT < 3 sec to all digits.  Protective sensation intact. Motor function intact.  LEFT foot: Incision is well coapted without any evidence of dehiscence and scars are well formed.  There is still some mild edema but there is no significant erythema there is no warmth.  Incisions are all well-healed.  No area pinpoint tenderness.  Decreased range of motion of the third MPJ.  MMT 5/5. No pain with calf compression, swelling, warmth, erythema.   Assessment and Plan:  Status post left foot surgery, doing well with no complications  -Treatment options discussed including all alternatives, risks, and complications -At this point discussed with her continue with wearing supportive shoe gear and stiffer soled shoes.  We will keep her at work with only 4 hours/day at home and that she can work past this.  I discussed with her I do believe that she should file for disability given her foot pain as I do not think she should be standing is hard surfaces.  She says other providers have had her cut back at work given her other medical concerns as well.  Trula Slade DPM

## 2021-04-27 ENCOUNTER — Other Ambulatory Visit: Payer: Self-pay

## 2021-04-27 NOTE — Progress Notes (Signed)
Cardiology Office Note:    Date:  04/28/2021   ID:  Darlene Lawson, DOB 28-Jan-1961, MRN [id number]  PCP:  Street, [place], MD  Cardiologist:  Shirlee More, MD    Referring MD: [address] [place], *    ASSESSMENT:    1. Chronic diastolic heart failure ([place])   2. Mild CAD   3. PAF (paroxysmal atrial fibrillation) ([place])   4. Chronic anticoagulation   5. OSA (obstructive sleep apnea)    PLAN:    In order of problems listed above:  Stable continue current loop diuretic I asked her to begin weighing daily and record Stable continue medical treatment I will ask her to resume low-dose aspirin 81 mg daily continue her statin Stable no recurrent sinusitis COVID-19 severe coronary infection No longer anticoagulated Continue CPAP   Next appointment: 6 months   Medication Adjustments/Labs and Tests Ordered: Current medicines are reviewed at length with the patient today.  Concerns regarding medicines are outlined above.  No orders of the defined types were placed in this encounter.  No orders of the defined types were placed in this encounter.   Chief Complaint  Patient presents with   Follow-up   Congestive Heart Failure   Coronary Artery Disease    History of Present Illness:    Darlene Lawson is a 60 y.o. female with a hx of congestive heart failure obstructive sleep apnea mild CAD DVT January 2022 in the setting of COVID-19 infection and pneumonia paroxysmal atrial fibrillation associated COVID-19 infection  Last seen 10/26/2020.  At that time she was markedly improved heart failure compensated no fluid overload or CAD with mild asymptomatic and she had no recurrence of atrial arrhythmia and was no longer anticoagulated.  She initiated CPAP therapy for obstructive sleep apnea..  Compliance with diet, lifestyle and medications: Yes  She has never recovered from [place] she has marked fatigue unable to do her work shift 4 hours a day and is breathless trying to walk  through [employer] Primary problem also with urinary incontinence and ongoing foot pain despite surgery Past Medical History:  Diagnosis Date   Acquired thrombophilia ([place])    Allergic rhinitis with postnasal drip    Anxiety    Aortic atherosclerosis ([facility])    Cellulitis of right lower extremity without foot    Chronic insomnia    Chronic respiratory failure with hypoxia ([place])    DOE (dyspnea on exertion)    Erythema multiforme    GERD (gastroesophageal reflux disease)    Hammertoe 06/18/2015   History of erythema multiforme 12/18/2019   Hypothyroidism    Ingrown nail 03/15/2015   Lower leg DVT (deep venous thromboembolism), acute, right ([facility])    Major depressive disorder with single episode    Menopausal problem    Mild persistent asthma, uncomplicated    Morbid obesity ([place])    Morton's neuroma of left foot 12/26/2012   Second and third intersoace neuromas noted.      Oral lichen planus 70/03/7492   OSA (obstructive sleep apnea)    Osteoarthritis    Overactive bladder    Pain in joint, ankle and foot 12/26/2012   Plantar fasciitis, left 12/26/2012   Managed with orthoses and NSAIDS    Plantar flexed metatarsal 06/18/2015   Radiculopathy, cervical region 05/01/2020   Restless leg syndrome    Spinal stenosis in cervical region 03/30/2020   Supplemental oxygen dependent    Surgery follow-up 06/18/2015    Past Surgical History:  Procedure Laterality Date   bladder  tack     fibroid tumor     LAMINECTOMY  02/2013   SALIVARY GLAND SURGERY  01/17/2017   removed    Current Medications: Current Meds  Medication Sig   albuterol (VENTOLIN HFA) 108 (90 Base) MCG/ACT inhaler Inhale 2 puffs into the lungs every 6 (six) hours as needed for wheezing or shortness of breath.   ascorbic acid (VITAMIN C) 500 MG tablet Take 500 mg by mouth daily.   BINAXNOW COVID-19 AG HOME TEST KIT Use as Directed on the Package   Biotin 5 MG TBDP Take 5 mg by mouth daily.   buPROPion (WELLBUTRIN XL) 300 MG 24  hr tablet Take 300 mg by mouth daily.   Calcium Carb-Cholecalciferol 600-400 MG-UNIT TABS Take 1 tablet by mouth daily.   calcium carbonate (OSCAL) 1500 (600 Ca) MG TABS tablet Take 600 mg of elemental calcium by mouth daily with breakfast.   Calcium Carbonate-Vit D-Min (CALCIUM 600+D PLUS MINERALS) 600-400 MG-UNIT TABS Take 1 tablet by mouth daily.   celecoxib (CELEBREX) 200 MG capsule Take 200 mg by mouth daily.   Cholecalciferol 50 MCG (2000 UT) CAPS Take 2,000 Units by mouth daily.   clobetasol cream (TEMOVATE) 2.69 % Apply 1 application topically 2 (two) times daily as needed (Rash).   clorazepate (TRANXENE) 3.75 MG tablet Take 3.75 mg by mouth at bedtime as needed for anxiety.   clotrimazole (MYCELEX) 10 MG troche Take 10 mg by mouth daily as needed (Mouth sore).   cycloSPORINE (SANDIMMUNE) 100 MG capsule Take 100 mg by mouth daily as needed (Breakout).   cycloSPORINE (SANDIMMUNE) 100 MG capsule Take by mouth.   diclofenac Sodium (VOLTAREN) 1 % GEL SMARTSIG:4 Gram(s) Topical 1-4 Times Daily   esomeprazole (NEXIUM) 20 MG capsule Take 20 mg by mouth daily at 12 noon.   estradiol-norethindrone (ACTIVELLA) 1-0.5 MG tablet Take 1 tablet by mouth daily.   finasteride (PROSCAR) 5 MG tablet Take 5 mg by mouth daily.   fluconazole (DIFLUCAN) 150 MG tablet Take by mouth.   fluticasone (FLONASE) 50 MCG/ACT nasal spray Place 2 sprays into both nostrils daily as needed for allergies or rhinitis.   Fluticasone-Umeclidin-Vilant 100-62.5-25 MCG/ACT AEPB Inhale 1 puff into the lungs daily.   folic acid (FOLVITE) 1 MG tablet Take 1 mg by mouth daily.   Krill Oil Ultra Strength 1500 MG CAPS Take 1 capsule by mouth daily.   levothyroxine (SYNTHROID) 200 MCG tablet Take 200 mcg by mouth daily.   loratadine-pseudoephedrine (CLARITIN-D 24-HOUR) 10-240 MG 24 hr tablet Take 1 tablet by mouth daily as needed for allergies.   Magnesium 250 MG TABS Take 250 mg by mouth 2 (two) times daily.   methotrexate  (RHEUMATREX) 2.5 MG tablet Take 10 mg by mouth once a week. Sunday's   metoprolol tartrate (LOPRESSOR) 100 MG tablet Take 1 tablet (100 mg total) by mouth once for 1 dose. Take two hours prior to your cardiac CT   metroNIDAZOLE (METROCREAM) 0.75 % cream Apply topically 2 (two) times daily.   metroNIDAZOLE (METROGEL) 1 % gel Apply 1 application topically daily.   ondansetron (ZOFRAN) 4 MG tablet Take 1 tablet (4 mg total) by mouth every 8 (eight) hours as needed for nausea or vomiting.   potassium chloride (KLOR-CON) 10 MEQ tablet Take 1 tablet (10 mEq total) by mouth daily.   pravastatin (PRAVACHOL) 20 MG tablet Take 1 tablet (20 mg total) by mouth every evening.   rOPINIRole (REQUIP) 2 MG tablet Take by mouth.   tacrolimus (PROGRAF) 1  MG capsule Take 1 mg by mouth every 12 (twelve) hours as needed (Breakout rash / mout sore).   tacrolimus (PROGRAF) 1 MG capsule Take by mouth.   torsemide (DEMADEX) 20 MG tablet Take 1 tablet (20 mg total) by mouth 2 (two) times daily.   traZODone (DESYREL) 100 MG tablet Take 100 mg by mouth at bedtime.   VESICARE 10 MG tablet Take 10 mg by mouth at bedtime.   Vibegron (GEMTESA) 75 MG TABS Take by mouth.   vitamin B-12 (CYANOCOBALAMIN) 500 MCG tablet Take 500 mcg by mouth daily.   vitamin E 180 MG (400 UNITS) capsule Take 400 Units by mouth daily.   [DISCONTINUED] colchicine 0.6 MG tablet Take 1 tablet (0.6 mg total) by mouth daily.   [DISCONTINUED] HYDROcodone-acetaminophen (NORCO/VICODIN) 5-325 MG tablet Take 1 tablet by mouth every 4 (four) hours as needed.   [DISCONTINUED] meclizine (ANTIVERT) 25 MG tablet Take 25 mg by mouth 2 (two) times daily as needed.   [DISCONTINUED] ondansetron (ZOFRAN-ODT) 4 MG disintegrating tablet DISSOLVE 1 TABLET IN MOUTH EVERY 12 HOURS AS NEEDED   [DISCONTINUED] oxyCODONE-acetaminophen (PERCOCET/ROXICET) 5-325 MG tablet Take 1-2 tablets by mouth every 6 (six) hours as needed for severe pain.     Allergies:   Penicillins,  Prednisone, and Sulfa antibiotics   Social History   Socioeconomic History   Marital status: Married    Spouse name: Not on file   Number of children: Not on file   Years of education: Not on file   Highest education level: Not on file  Occupational History   Not on file  Tobacco Use   Smoking status: Former    Packs/day: 1.50    Years: 20.00    Pack years: 30.00    Types: Cigarettes    Quit date: 41    Years since quitting: 30.1   Smokeless tobacco: Never  Substance and Sexual Activity   Alcohol use: No   Drug use: No   Sexual activity: Not on file  Other Topics Concern   Not on file  Social History Narrative   Not on file   Social Determinants of Health   Financial Resource Strain: Not on file  Food Insecurity: Not on file  Transportation Needs: Not on file  Physical Activity: Not on file  Stress: Not on file  Social Connections: Not on file     Family History: The patient's family history includes COPD in her father; Diabetes in her brother, father, mother, and sister; Heart disease in her mother; Lung cancer in her father; Stroke in her father. ROS:   Please see the history of present illness.    All other systems reviewed and are negative.  EKGs/Labs/Other Studies Reviewed:    The following studies were reviewed today:    Recent Labs: 08/31/2020: Hemoglobin 13.9; NT-Pro BNP 259; Platelets 248 09/22/2020: BUN 15; Creatinine, Ser 0.69; Potassium 4.2; Sodium 140  Recent Lipid Panel No results found for: CHOL, TRIG, HDL, CHOLHDL, VLDL, LDLCALC, LDLDIRECT  Physical Exam:    VS:  BP 122/82 (BP Location: Right Arm, Patient Position: Sitting)    Pulse (!) 58    Ht 5' 3"  (1.6 m)    Wt 244 lb 9.6 oz (110.9 kg)    SpO2 95%    BMI 43.33 kg/m     Wt Readings from Last 3 Encounters:  04/28/21 244 lb 9.6 oz (110.9 kg)  10/26/20 241 lb 12.8 oz (109.7 kg)  09/23/20 237 lb 12.8 oz (107.9 kg)  GEN:  Well nourished, well developed in no acute distress HEENT:  Normal NECK: No JVD; No carotid bruits LYMPHATICS: No lymphadenopathy CARDIAC: RRR, no murmurs, rubs, gallops RESPIRATORY:  Clear to auscultation without rales, wheezing or rhonchi  ABDOMEN: Soft, non-tender, non-distended MUSCULOSKELETAL: 1+ bilateral lower extremity pitting edema; No deformity  SKIN: Warm and dry NEUROLOGIC:  Alert and oriented x 3 PSYCHIATRIC:  Normal affect    Signed, Shirlee More, MD  04/28/2021 2:31 PM    [place]

## 2021-04-28 ENCOUNTER — Ambulatory Visit: Payer: BC Managed Care – PPO | Admitting: Cardiology

## 2021-04-28 ENCOUNTER — Encounter: Payer: Self-pay | Admitting: Cardiology

## 2021-04-28 ENCOUNTER — Other Ambulatory Visit: Payer: Self-pay

## 2021-04-28 VITALS — BP 122/82 | HR 58 | Ht 63.0 in | Wt 244.6 lb

## 2021-04-28 DIAGNOSIS — R0602 Shortness of breath: Secondary | ICD-10-CM

## 2021-04-28 DIAGNOSIS — I2510 Atherosclerotic heart disease of native coronary artery without angina pectoris: Secondary | ICD-10-CM | POA: Diagnosis not present

## 2021-04-28 DIAGNOSIS — I480 Paroxysmal atrial fibrillation: Secondary | ICD-10-CM | POA: Diagnosis not present

## 2021-04-28 DIAGNOSIS — I5032 Chronic diastolic (congestive) heart failure: Secondary | ICD-10-CM

## 2021-04-28 DIAGNOSIS — G4733 Obstructive sleep apnea (adult) (pediatric): Secondary | ICD-10-CM

## 2021-04-28 DIAGNOSIS — Z7901 Long term (current) use of anticoagulants: Secondary | ICD-10-CM | POA: Diagnosis not present

## 2021-04-28 MED ORDER — ASPIRIN EC 81 MG PO TBEC: 81.0000 mg | DELAYED_RELEASE_TABLET | Freq: Every day | ORAL | 3 refills | Status: AC

## 2021-04-28 NOTE — Patient Instructions (Signed)

## 2021-04-29 ENCOUNTER — Telehealth: Payer: Self-pay | Admitting: Cardiology

## 2021-04-29 ENCOUNTER — Telehealth: Payer: Self-pay

## 2021-04-29 LAB — PRO B NATRIURETIC PEPTIDE: NT-Pro BNP: 479 pg/mL — ABNORMAL HIGH (ref 0–287)

## 2021-04-29 LAB — BASIC METABOLIC PANEL
BUN/Creatinine Ratio: 19 (ref 12–28)
BUN: 14 mg/dL (ref 8–27)
CO2: 24 mmol/L (ref 20–29)
Calcium: 9.4 mg/dL (ref 8.7–10.3)
Chloride: 100 mmol/L (ref 96–106)
Creatinine, Ser: 0.75 mg/dL (ref 0.57–1.00)
Glucose: 75 mg/dL (ref 70–99)
Potassium: 4.2 mmol/L (ref 3.5–5.2)
Sodium: 139 mmol/L (ref 134–144)
eGFR: 90 mL/min/{1.73_m2} (ref >59)

## 2021-04-29 NOTE — Telephone Encounter (Signed)
-----   Message from Richardo Priest, MD sent at 04/29/2021 10:06 AM EST ----- Normal or stable result

## 2021-04-29 NOTE — Telephone Encounter (Signed)
Spoke with patient regarding results and recommendation.  Patient verbalizes understanding and is agreeable to plan of care. Advised patient to call back with any issues or concerns.  

## 2021-04-29 NOTE — Telephone Encounter (Signed)
Pt returning call for results... please advise  

## 2021-04-29 NOTE — Telephone Encounter (Signed)
Left message on patients voicemail to please return our call.   

## 2021-05-17 ENCOUNTER — Other Ambulatory Visit: Payer: Self-pay | Admitting: Cardiology

## 2021-05-25 ENCOUNTER — Telehealth: Payer: Self-pay | Admitting: Podiatry

## 2021-05-25 NOTE — Telephone Encounter (Signed)
Darlene Lawson has been having a lot of pain with her surgical foot. She has been out of work since 05/07/2021. She has FMLA forms that needs to be completed for her. She is scheduled for a appt on 06/01/2021, but actually needs the FMLA completed before then. Please advise.

## 2021-06-01 ENCOUNTER — Other Ambulatory Visit: Payer: Self-pay

## 2021-06-01 ENCOUNTER — Ambulatory Visit: Payer: 59

## 2021-06-01 ENCOUNTER — Ambulatory Visit (INDEPENDENT_AMBULATORY_CARE_PROVIDER_SITE_OTHER): Payer: 59 | Admitting: Podiatry

## 2021-06-01 DIAGNOSIS — M216X2 Other acquired deformities of left foot: Secondary | ICD-10-CM

## 2021-06-01 DIAGNOSIS — M778 Other enthesopathies, not elsewhere classified: Secondary | ICD-10-CM | POA: Diagnosis not present

## 2021-06-01 DIAGNOSIS — Z9889 Other specified postprocedural states: Secondary | ICD-10-CM

## 2021-06-01 MED ORDER — GABAPENTIN 300 MG PO CAPS: 300.0000 mg | ORAL_CAPSULE | Freq: Every day | ORAL | 0 refills | Status: DC

## 2021-06-05 NOTE — Progress Notes (Signed)
Subjective: ?Darlene Lawson is a 61 y.o. is seen today in office s/p left foot submetatarsal osteotomy, third metatarsal head resection with hammertoe repair, tailor's bunionectomy preformed on 11/18/2020.  She states that she is having difficulty working 4 hours a day so she has had a stop working because of swelling and discomfort she gets after being on his hard surfaces all day.  She also has other medical issues that is contributing to her not being able to work.  No recent injury or changes otherwise.  She has no new concerns.  She is wearing a shoe full-time. ? ?Objective: ?General: No acute distress, AAOx3  ?DP/PT pulses palpable 2/4, CRT < 3 sec to all digits.  ?Protective sensation intact. Motor function intact.  ?LEFT foot: Incision is well coapted without any evidence of dehiscence and scars are well formed.  Toes are still in rectus position except the fourth toe does have adductovarus noted.  She gets tenderness to the fourth toe as well as swelling.  Decreased range of motion of the MPJs.  MMT 5/5. ?No pain with calf compression, swelling, warmth, erythema.  ? ?Assessment and Plan:  ?Status post left foot surgery ? ?-Treatment options discussed including all alternatives, risks, and complications ?-X-rays obtained reviewed.  No evidence of acute fracture.  Overall mostly unchanged.  Deformity noted to the third metatarsal phalangeal joint. ?-She is back to her regular shoe and she is able to do daily activities with limitations but she is not able to stand for long periods of time at work on hard surfaces.  Due to this, I will keep her out of work on medical leave. ?-Continue range of motion exercises, compression, elevation. ? ?Darlene Lawson DPM ? ?

## 2021-06-30 IMAGING — CT CT HEART MORP W/ CTA COR W/ SCORE W/ CA W/CM &/OR W/O CM
3 of 9 series · 6 of 20 positions shown, 7 images · IV contrast (omnipaque)
Comparison: None.
COMPARISON: None.

Addendum:
EXAM:
OVER-READ INTERPRETATION  CT CHEST

The following report is an over-read performed by radiologist Dr.
Ladarrius Gorden [REDACTED] on 09/08/2020. This
over-read does not include interpretation of cardiac or coronary
anatomy or pathology. The coronary calcium score/coronary CTA
interpretation by the cardiologist is attached.
HISTORY: Chest pain/anginal equiv, 10yr CHD risk > 20%, not treadmill
candidate
Cardiac/Coronary  CT
TECHNIQUE: The patient was scanned on a Siemens Force scanner.
PROTOCOL: A 120 kV prospective scan was triggered in the descending thoracic
aorta at 111 HU's. Axial non-contrast 3 mm slices were carried out
through the heart. The data set was analyzed on a dedicated work
station and scored using the Agatston method. Gantry rotation speed
was 250 msecs and collimation was .6 mm. Beta blockade and 0.8 mg of
sl NTG was given. The 3D data set was reconstructed in 5% intervals
of the 35-75 % of the R-R cycle. Systolic and diastolic phases were
analyzed on a dedicated work station using MPR, MIP and VRT modes.
The patient received 100mL OMNIPAQUE IOHEXOL 350 MG/ML SOLN
contrast.

[Series 7: best syst 38 % · axial · 0.39mm/px · z∈[-122,-83]mm · 2 of 291 slices shown, 3 images]
[im 97/291  vessel]
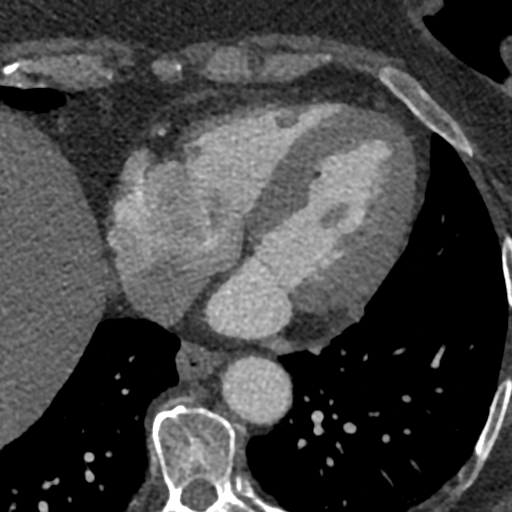
[im 97/291  lung]
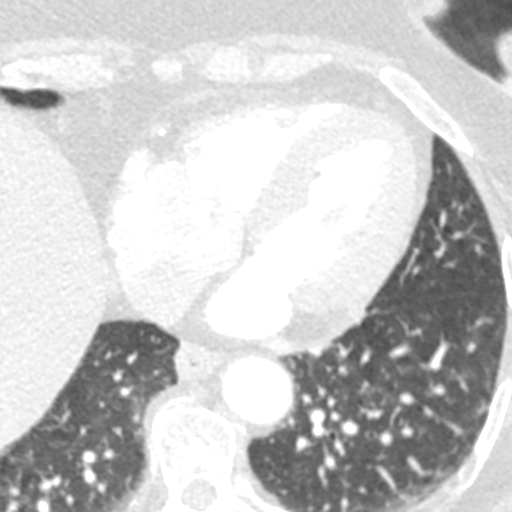
[im 194/291  vessel]
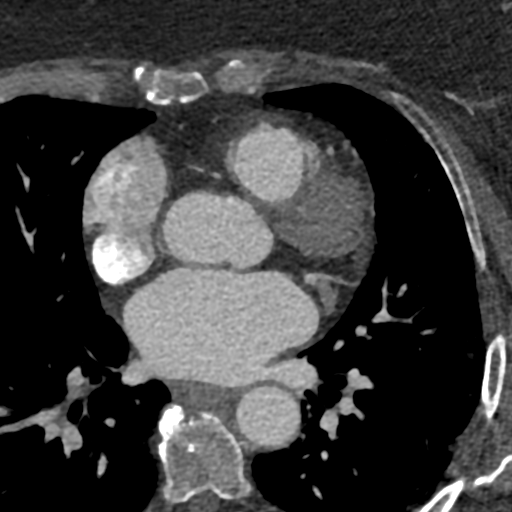

[Series 8: ts diast sharp 70 % · axial · 0.39mm/px · z∈[-122,-83]mm · 2 of 291 slices shown]
[im 97/291  lung]
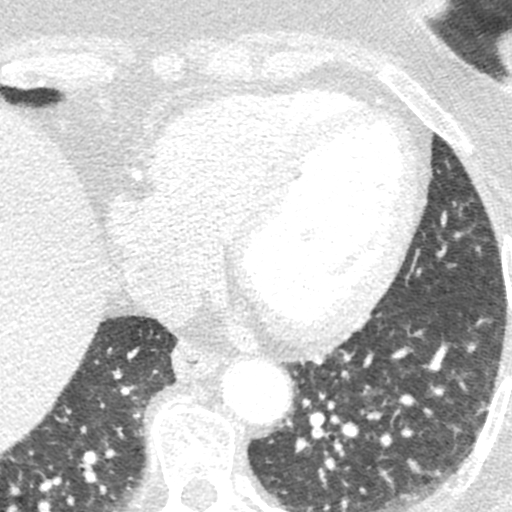
[im 194/291  lung]
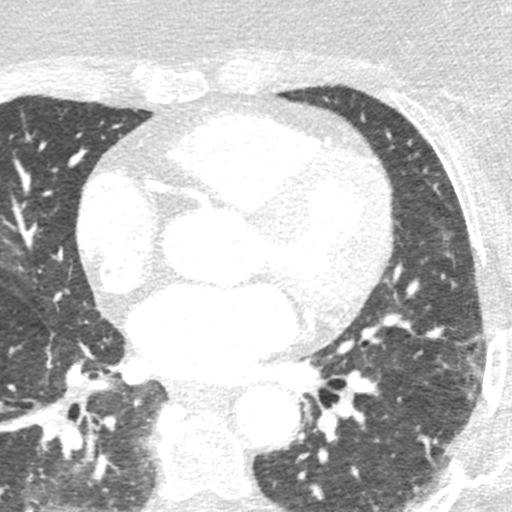

[Series 9: ts syst sharp 38 % · axial · 0.39mm/px · z∈[-122,-83]mm · 2 of 291 slices shown]
[im 97/291  lung]
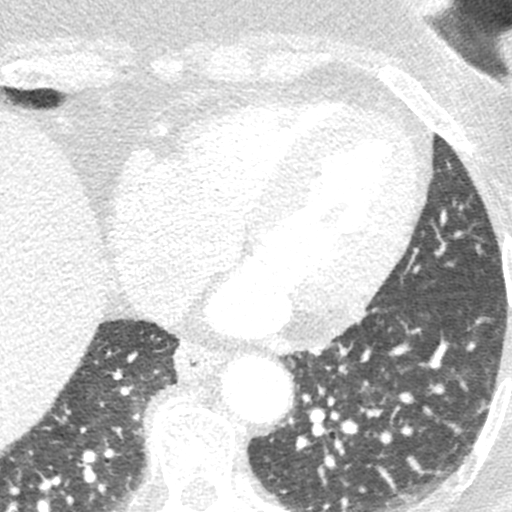
[im 194/291  lung]
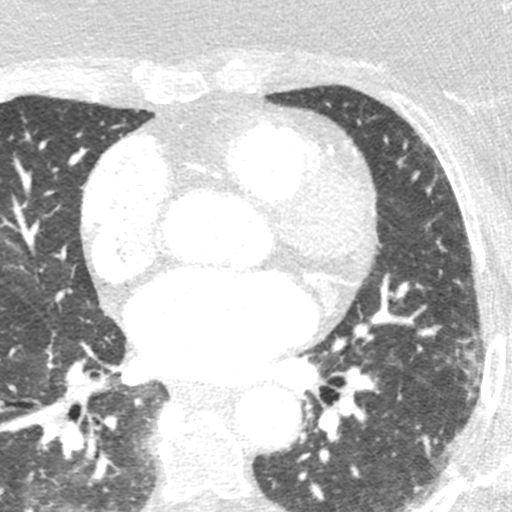

[6 of 20 positions shown; findings below may reference images not displayed]

FINDINGS: Within the visualized portions of the thorax there are no suspicious
appearing pulmonary nodules or masses, there is no acute
consolidative airspace disease, no pleural effusions, no
pneumothorax and no lymphadenopathy. Visualized portions of the
upper abdomen demonstrates several subcentimeter low-attenuation
lesions in the liver, too small to characterize, but statistically
likely to represent small cysts. There are no aggressive appearing
lytic or blastic lesions noted in the visualized portions of the
skeleton. Old partially healed left-sided lateral rib fracture
incidentally noted.
IMPRESSION: 1. No significant incidental noncardiac findings are noted.
FINDINGS: Image quality: Average, poor contrast bolus timing.

Artifact: Limited.

Coronary calcium score is 16, which places the patient in the 72nd
percentile for age and sex matched control.

Coronary arteries: Normal coronary origins.  Right dominance.

Right Coronary Artery: No detectable plaque or stenosis.

Left Main Coronary Artery: No detectable plaque or stenosis.

Left Anterior Descending Coronary Artery: Mild mixed atherosclerotic
plaque in the proximal LAD, 25-49% stenosis. No detectable plaque or
stenosis in the mid LAD. Distal LAD is not well visualized but
appears patent.

Left Circumflex Artery: No detectable plaque or stenosis.

Aorta: Normal size, 33 mm at the mid ascending aorta (level of the
PA bifurcation) measured double oblique. No calcifications. No
dissection.

Aortic Valve: Trivial calcifications.

Other findings:

Normal pulmonary vein drainage into the left atrium.

Normal left atrial appendage without a thrombus.

Normal size of the pulmonary artery.
IMPRESSION: 1.  Mild CAD in proximal LAD, CADRADS = 2.

2. Coronary calcium score is 16, which places the patient in the
72nd percentile for age and sex matched control.

3. Normal coronary origin with right dominance.

4.  LVEF approximately 58%.

*** End of Addendum ***
EXAM:
OVER-READ INTERPRETATION  CT CHEST

The following report is an over-read performed by radiologist Dr.
Ladarrius Gorden [REDACTED] on 09/08/2020. This
over-read does not include interpretation of cardiac or coronary
anatomy or pathology. The coronary calcium score/coronary CTA
interpretation by the cardiologist is attached.
FINDINGS: Within the visualized portions of the thorax there are no suspicious
appearing pulmonary nodules or masses, there is no acute
consolidative airspace disease, no pleural effusions, no
pneumothorax and no lymphadenopathy. Visualized portions of the
upper abdomen demonstrates several subcentimeter low-attenuation
lesions in the liver, too small to characterize, but statistically
likely to represent small cysts. There are no aggressive appearing
lytic or blastic lesions noted in the visualized portions of the
skeleton. Old partially healed left-sided lateral rib fracture
incidentally noted.
IMPRESSION: 1. No significant incidental noncardiac findings are noted.

## 2021-07-27 ENCOUNTER — Ambulatory Visit: Payer: 59 | Admitting: Podiatry

## 2021-08-09 ENCOUNTER — Ambulatory Visit: Payer: BC Managed Care – PPO | Admitting: Podiatry

## 2021-08-09 DIAGNOSIS — M79672 Pain in left foot: Secondary | ICD-10-CM

## 2021-08-09 DIAGNOSIS — G8929 Other chronic pain: Secondary | ICD-10-CM

## 2021-08-09 DIAGNOSIS — M216X2 Other acquired deformities of left foot: Secondary | ICD-10-CM | POA: Diagnosis not present

## 2021-08-09 DIAGNOSIS — M778 Other enthesopathies, not elsewhere classified: Secondary | ICD-10-CM

## 2021-08-11 ENCOUNTER — Other Ambulatory Visit: Payer: Self-pay | Admitting: Cardiology

## 2021-08-11 ENCOUNTER — Other Ambulatory Visit: Payer: Self-pay | Admitting: Podiatry

## 2021-08-11 NOTE — Progress Notes (Signed)
Subjective: ?Darlene Lawson is a 61 y.o. is seen today for follow-up evaluation of ongoing foot pain the left side.  She was experiencing some discomfort in the side of her foot that has resolved.  She states that she has difficulty for standing or walking for long period of time.  She recently went to the [employer] and was not wearing her regular supportive shoes and after 45 minutes she was not able to do any further walking because the discomfort she gets to her foot.  No recent injury or changes.  She brings in orthotics for me to evaluate.  She has been wearing Hoka shoes which have been helpful. ? ?Objective: ?General: No acute distress, AAOx3  ?DP/PT pulses palpable 2/4, CRT < 3 sec to all digits.  ?Protective sensation intact. Motor function intact.  ?LEFT foot: Incision is well coapted without any evidence of dehiscence and scars are well formed.  Toes are still in rectus position except the fourth toe does have adductovarus noted.  Overall the position is unchanged.  She describes a pulling sensation more on the third, fourth toe dorsally.  Decreased range of motion of the MPJ.  Decreased range of motion of the MPJs.  MMT 5/5. ?No pain with calf compression, swelling, warmth, erythema.  ? ?Assessment and Plan:  ?Chronic left foot pain, arthritis ? ?-Treatment options discussed including all alternatives, risks, and complications ?-At this point she still not able to work as she not able to stand on hard surfaces for an extended period of time.  She did bring some orthotics today we further evaluate this.  We the inside of her Hoka's but I think that the [facility] may already provide enough support for her.  Discussed wearing the orthotics and other shoes. ?-Patient started on gabapentin which have been helpful and will continue with this. ?-Unfortunate she is not able to return to work.  Provided out of work for 2 more months until her next follow-up.  Given her chronic foot pain which has been ongoing for  many years she is unable to work and I do support her claim of disability. ? ?Trula Slade DPM ? ?

## 2021-08-12 NOTE — Telephone Encounter (Signed)
Please advise 

## 2021-10-01 ENCOUNTER — Other Ambulatory Visit: Payer: Self-pay | Admitting: Cardiology

## 2021-10-18 ENCOUNTER — Ambulatory Visit: Payer: BC Managed Care – PPO | Admitting: Podiatry

## 2021-10-18 ENCOUNTER — Ambulatory Visit: Payer: 59

## 2021-10-18 DIAGNOSIS — Z9889 Other specified postprocedural states: Secondary | ICD-10-CM

## 2021-10-18 DIAGNOSIS — M2042 Other hammer toe(s) (acquired), left foot: Secondary | ICD-10-CM

## 2021-10-18 NOTE — Progress Notes (Signed)
Subjective: Darlene Lawson is a 61 y.o. is seen today for follow-up evaluation of ongoing foot pain the left side.  She states that the fourth toe is curled more causing discomfort.  She states that she is not able to stand or walk for extended period time especially on hard surfaces without causing foot pain.  No recent injury or change otherwise.   Objective: General: No acute distress, AAOx3  DP/PT pulses palpable 2/4, CRT < 3 sec to all digits.  Protective sensation intact. Motor function intact.  LEFT foot: Incision is well coapted without any evidence of dehiscence and scars are well formed.  Toes and does not feel that she is got the fourth toe which does adductovarus present. Worsened compared to prior but is causing discomfort mostly on the DIPJ. Decreased range of motion of the MPJ.  Decreased range of motion of the MPJs.  MMT 5/5. No pain with calf compression, swelling, warmth, erythema.   Assessment and Plan:  Chronic left foot pain, arthritis  -Treatment options discussed including all alternatives, risks, and complications -X-rays obtained and reviewed.  Adductovarus present of fourth toe. -She does continue doing better but she still having discomfort after standing or walking for long period of time.  Given this and her working at [employer] concrete surfaces is difficult for her to stand for extended.  I would extend her out of work until December 26, 2021. -Continue with supportive shoe gear and gradually increase activity level as tolerated.  Dispensed toe splints to help with the fourth toe.  She is not interested in further surgery at this time.  Trula Slade DPM

## 2021-10-28 ENCOUNTER — Encounter: Payer: Self-pay | Admitting: *Deleted

## 2021-10-28 ENCOUNTER — Other Ambulatory Visit: Payer: Self-pay | Admitting: *Deleted

## 2021-10-28 ENCOUNTER — Encounter: Payer: Self-pay | Admitting: Cardiology

## 2021-10-28 ENCOUNTER — Ambulatory Visit: Payer: BC Managed Care – PPO | Admitting: Cardiology

## 2021-10-28 VITALS — BP 130/80 | HR 66 | Ht 63.0 in | Wt 242.0 lb

## 2021-10-28 DIAGNOSIS — G4733 Obstructive sleep apnea (adult) (pediatric): Secondary | ICD-10-CM

## 2021-10-28 DIAGNOSIS — I480 Paroxysmal atrial fibrillation: Secondary | ICD-10-CM | POA: Diagnosis not present

## 2021-10-28 DIAGNOSIS — I5032 Chronic diastolic (congestive) heart failure: Secondary | ICD-10-CM | POA: Diagnosis not present

## 2021-10-28 DIAGNOSIS — E785 Hyperlipidemia, unspecified: Secondary | ICD-10-CM

## 2021-10-28 DIAGNOSIS — Z7901 Long term (current) use of anticoagulants: Secondary | ICD-10-CM | POA: Diagnosis not present

## 2021-10-28 DIAGNOSIS — I2510 Atherosclerotic heart disease of native coronary artery without angina pectoris: Secondary | ICD-10-CM | POA: Diagnosis not present

## 2021-10-28 NOTE — Patient Instructions (Signed)

## 2021-10-28 NOTE — Progress Notes (Signed)
Cardiology Office Note:    Date:  10/28/2021   ID:  Darlene Lawson, DOB 30-May-1960, MRN [id number]  PCP:  Street, Sharon Mt, MD  Cardiologist:  Shirlee More, MD    Referring MD: [address], Sharon Mt, *please do a CMP lipid profile and proBNP level with her upcoming labs in your office   ASSESSMENT:    1. Chronic diastolic heart failure ([place])   2. Mild CAD   3. PAF (paroxysmal atrial fibrillation) ([place])   4. Chronic anticoagulation   5. OSA (obstructive sleep apnea)   6. Dyslipidemia    PLAN:    In order of problems listed above:  Her heart failure is compensated she will continue her current loop diuretic I will ask her PCP to do a proBNP level with labs that are upcoming in the office.  I stressed the importance of sodium restriction and compliance of medications Stable mild nonobstructive CAD she is having no anginal discomfort continue treatment including antiplatelet aspirin and lipid-lowering pravastatin. No longer anticoagulated for DVT She will continue her statin upcoming labs in her PCP office   Next appointment: 6 months   Medication Adjustments/Labs and Tests Ordered: Current medicines are reviewed at length with the patient today.  Concerns regarding medicines are outlined above.  Orders Placed This Encounter  Procedures   EKG 12-Lead   No orders of the defined types were placed in this encounter.   Chief Complaint  Patient presents with   Follow-up  With diastolic heart failure  History of Present Illness:    Darlene Lawson is a 61 y.o. female with a hx of chronic diastolic congestive heart failure EF 55 to 60% April 2022 obstructive sleep apnea mild CAD DVT January 2022 in the setting of COVID-19 infection and pneumonia paroxysmal atrial fibrillation associated COVID-19 infection  last seen 04/28/2021 with compensated heart failure..  Compliance with diet, lifestyle and medications: Yes  She has never fully recovered after COVID-19 infection. She  has a smart watch and she received 1 rapid heart rate alert but did not capture the heart rhythm She is activated her atrial fibrillation detection Otherwise no palpitation or syncope She takes her diuretic but still has shortness of breath with more than light activities if she goes outside to walk she will be short of breath at the stop pause and rest. She is not having edema orthopnea chest pain. Recent labs 04/28/2021 potassium 4.2 creatinine 0.75 She tolerates her statin without muscle pain or weakness Past Medical History:  Diagnosis Date   Acquired thrombophilia ([place])    Allergic rhinitis with postnasal drip    Anxiety    Aortic atherosclerosis ([facility])    Cellulitis of right lower extremity without foot    Chronic insomnia    Chronic respiratory failure with hypoxia ([facility])    DOE (dyspnea on exertion)    Erythema multiforme    GERD (gastroesophageal reflux disease)    Hammertoe 06/18/2015   History of erythema multiforme 12/18/2019   Hypothyroidism    Ingrown nail 03/15/2015   Lower leg DVT (deep venous thromboembolism), acute, right ([facility])    Major depressive disorder with single episode    Menopausal problem    Mild persistent asthma, uncomplicated    Morbid obesity ([place])    Morton's neuroma of left foot 12/26/2012   Second and third intersoace neuromas noted.      Oral lichen planus 57/26/2035   OSA (obstructive sleep apnea)    Osteoarthritis    Overactive bladder    Pain  in joint, ankle and foot 12/26/2012   Plantar fasciitis, left 12/26/2012   Managed with orthoses and NSAIDS    Plantar flexed metatarsal 06/18/2015   Radiculopathy, cervical region 05/01/2020   Restless leg syndrome    Spinal stenosis in cervical region 03/30/2020   Supplemental oxygen dependent    Surgery follow-up 06/18/2015    Past Surgical History:  Procedure Laterality Date   bladder tack     fibroid tumor     LAMINECTOMY  02/2013   SALIVARY GLAND SURGERY  01/17/2017   removed    Current  Medications: Current Meds  Medication Sig   albuterol (VENTOLIN HFA) 108 (90 Base) MCG/ACT inhaler Inhale 2 puffs into the lungs every 6 (six) hours as needed for wheezing or shortness of breath.   ascorbic acid (VITAMIN C) 500 MG tablet Take 500 mg by mouth daily.   aspirin EC 81 MG tablet Take 1 tablet (81 mg total) by mouth daily. Swallow whole.   BINAXNOW COVID-19 AG HOME TEST KIT Use as Directed on the Package   Biotin 5 MG TBDP Take 5 mg by mouth daily.   buPROPion (WELLBUTRIN XL) 300 MG 24 hr tablet Take 300 mg by mouth daily.   Calcium Carb-Cholecalciferol 600-400 MG-UNIT TABS Take 1 tablet by mouth daily.   calcium carbonate (OSCAL) 1500 (600 Ca) MG TABS tablet Take 600 mg of elemental calcium by mouth daily with breakfast.   Calcium Carbonate-Vit D-Min (CALCIUM 600+D PLUS MINERALS) 600-400 MG-UNIT TABS Take 1 tablet by mouth daily.   celecoxib (CELEBREX) 200 MG capsule Take 200 mg by mouth daily.   Cholecalciferol 50 MCG (2000 UT) CAPS Take 2,000 Units by mouth daily.   clobetasol cream (TEMOVATE) 9.52 % Apply 1 application topically 2 (two) times daily as needed (Rash).   clorazepate (TRANXENE) 3.75 MG tablet Take 3.75 mg by mouth at bedtime as needed for anxiety.   clotrimazole (MYCELEX) 10 MG troche Take 10 mg by mouth daily as needed (Mouth sore).   cycloSPORINE (SANDIMMUNE) 100 MG capsule Take 100 mg by mouth daily as needed (Breakout).   diclofenac Sodium (VOLTAREN) 1 % GEL SMARTSIG:4 Gram(s) Topical 1-4 Times Daily   esomeprazole (NEXIUM) 20 MG capsule Take 20 mg by mouth daily at 12 noon.   estradiol-norethindrone (ACTIVELLA) 1-0.5 MG tablet Take 1 tablet by mouth daily.   finasteride (PROSCAR) 5 MG tablet Take 5 mg by mouth daily.   FLOVENT DISKUS 100 MCG/ACT AEPB Take 1 puff by mouth 2 (two) times daily.   fluconazole (DIFLUCAN) 150 MG tablet Take by mouth.   fluticasone (FLONASE) 50 MCG/ACT nasal spray Place 2 sprays into both nostrils daily as needed for allergies or  rhinitis.   Fluticasone-Umeclidin-Vilant 100-62.5-25 MCG/ACT AEPB Inhale 1 puff into the lungs daily.   folic acid (FOLVITE) 1 MG tablet Take 1 mg by mouth daily.   gabapentin (NEURONTIN) 300 MG capsule Take 1 capsule by mouth at bedtime   ketoconazole (NIZORAL) 2 % shampoo Apply 1 Application topically 3 (three) times a week.   Krill Oil Ultra Strength 1500 MG CAPS Take 1 capsule by mouth daily.   levothyroxine (SYNTHROID) 200 MCG tablet Take 200 mcg by mouth daily.   loratadine-pseudoephedrine (CLARITIN-D 24-HOUR) 10-240 MG 24 hr tablet Take 1 tablet by mouth daily as needed for allergies.   Magnesium 250 MG TABS Take 250 mg by mouth 2 (two) times daily.   methotrexate (RHEUMATREX) 2.5 MG tablet Take 10 mg by mouth once a week. Sunday's   metroNIDAZOLE (  METROCREAM) 0.75 % cream Apply topically 2 (two) times daily.   metroNIDAZOLE (METROGEL) 1 % gel Apply 1 application topically daily.   ondansetron (ZOFRAN) 4 MG tablet Take 1 tablet (4 mg total) by mouth every 8 (eight) hours as needed for nausea or vomiting.   polyethylene glycol (MIRALAX / GLYCOLAX) 17 g packet Take 17 g by mouth 2 (two) times daily.   potassium chloride (KLOR-CON) 10 MEQ tablet Take 1 tablet by mouth once daily   pravastatin (PRAVACHOL) 20 MG tablet Take 1 tablet (20 mg total) by mouth every evening.   rOPINIRole (REQUIP) 2 MG tablet Take by mouth.   tacrolimus (PROGRAF) 1 MG capsule Take 1 mg by mouth every 12 (twelve) hours as needed (Breakout rash / mout sore).   torsemide (DEMADEX) 20 MG tablet Take 1 tablet by mouth twice daily   traZODone (DESYREL) 100 MG tablet Take 100 mg by mouth at bedtime.   VESICARE 10 MG tablet Take 10 mg by mouth at bedtime.   Vibegron (GEMTESA) 75 MG TABS Take by mouth.   vitamin B-12 (CYANOCOBALAMIN) 500 MCG tablet Take 500 mcg by mouth daily.   vitamin E 180 MG (400 UNITS) capsule Take 400 Units by mouth daily.     Allergies:   Penicillins, Clindamycin, Prednisone, and Sulfa  antibiotics   Social History   Socioeconomic History   Marital status: Married    Spouse name: Not on file   Number of children: Not on file   Years of education: Not on file   Highest education level: Not on file  Occupational History   Not on file  Tobacco Use   Smoking status: Former    Packs/day: 1.50    Years: 20.00    Total pack years: 30.00    Types: Cigarettes    Quit date: 11    Years since quitting: 30.6   Smokeless tobacco: Never  Substance and Sexual Activity   Alcohol use: No   Drug use: No   Sexual activity: Not on file  Other Topics Concern   Not on file  Social History Narrative   Not on file   Social Determinants of Health   Financial Resource Strain: Not on file  Food Insecurity: Not on file  Transportation Needs: Not on file  Physical Activity: Not on file  Stress: Not on file  Social Connections: Not on file     Family History: The patient's family history includes COPD in her father; Diabetes in her brother, father, mother, and sister; Heart disease in her mother; Lung cancer in her father; Stroke in her father. ROS:   Please see the history of present illness.    All other systems reviewed and are negative.  EKGs/Labs/Other Studies Reviewed:    The following studies were reviewed today:  EKG:  EKG ordered today and personally reviewed.  The ekg ordered today demonstrates sinus rhythm poor R wave progression otherwise normal  Physical Exam:    VS:  BP 130/80 (BP Location: Right Arm, Patient Position: Sitting, Cuff Size: Normal)   Pulse 66   Ht 5' 3"  (1.6 m)   Wt 242 lb (109.8 kg)   SpO2 95%   BMI 42.87 kg/m     Wt Readings from Last 3 Encounters:  10/28/21 242 lb (109.8 kg)  04/28/21 244 lb 9.6 oz (110.9 kg)  10/26/20 241 lb 12.8 oz (109.7 kg)     GEN:  Well nourished, well developed in no acute distress HEENT: Normal NECK: No JVD;  No carotid bruits LYMPHATICS: No lymphadenopathy CARDIAC: RRR, no murmurs, rubs,  gallops RESPIRATORY:  Clear to auscultation without rales, wheezing or rhonchi  ABDOMEN: Soft, non-tender, non-distended MUSCULOSKELETAL:  No edema; No deformity  SKIN: Warm and dry NEUROLOGIC:  Alert and oriented x 3 PSYCHIATRIC:  Normal affect    Signed, Shirlee More, MD  10/28/2021 2:48 PM    [facility]

## 2021-11-09 ENCOUNTER — Other Ambulatory Visit: Payer: Self-pay | Admitting: Cardiology

## 2021-11-09 NOTE — Telephone Encounter (Signed)
Rx refill sent to pharmacy. 

## 2021-12-02 ENCOUNTER — Telehealth: Payer: Self-pay | Admitting: Podiatry

## 2021-12-02 NOTE — Telephone Encounter (Signed)
FYI Pt called and needs to change her appt on 9.25 to a different afternoon as she has another appt in [place] 30 minutes from our appt. Upon checking he did not have any availability to move the appt currently but I did add to waitlist. She is to return to work 10.1.2023 and asked if we could not get her in that week if we needed to could we extend her return to work note and I told her I don't think that will be a issue if we cannot get the appt moved but we are waiting to closer to the appt date to see if we can get her moved.

## 2021-12-03 ENCOUNTER — Encounter: Payer: Self-pay | Admitting: Podiatry

## 2021-12-07 ENCOUNTER — Telehealth: Payer: Self-pay | Admitting: Podiatry

## 2021-12-07 NOTE — Telephone Encounter (Signed)
Mrs. Winthrop would like to be allowed to return to work 4 hours a day. She wants to work 1 day a week, to keep from losing her job. She also wants to be allowed to sit 66mn out of an hour, or as needed.

## 2021-12-09 ENCOUNTER — Encounter: Payer: Self-pay | Admitting: Podiatry

## 2021-12-20 ENCOUNTER — Ambulatory Visit: Payer: 59 | Admitting: Podiatry

## 2022-02-14 ENCOUNTER — Ambulatory Visit (INDEPENDENT_AMBULATORY_CARE_PROVIDER_SITE_OTHER): Payer: BC Managed Care – PPO

## 2022-02-14 ENCOUNTER — Encounter: Payer: Self-pay | Admitting: Podiatry

## 2022-02-14 ENCOUNTER — Ambulatory Visit (INDEPENDENT_AMBULATORY_CARE_PROVIDER_SITE_OTHER): Payer: BC Managed Care – PPO | Admitting: Podiatry

## 2022-02-14 DIAGNOSIS — M2042 Other hammer toe(s) (acquired), left foot: Secondary | ICD-10-CM | POA: Diagnosis not present

## 2022-02-14 DIAGNOSIS — M216X2 Other acquired deformities of left foot: Secondary | ICD-10-CM | POA: Diagnosis not present

## 2022-02-14 DIAGNOSIS — M778 Other enthesopathies, not elsewhere classified: Secondary | ICD-10-CM

## 2022-02-14 DIAGNOSIS — G8929 Other chronic pain: Secondary | ICD-10-CM

## 2022-02-14 DIAGNOSIS — M79672 Pain in left foot: Secondary | ICD-10-CM

## 2022-02-14 MED ORDER — GABAPENTIN 300 MG PO CAPS: 300.0000 mg | ORAL_CAPSULE | Freq: Every day | ORAL | 0 refills | Status: DC

## 2022-02-14 NOTE — Progress Notes (Signed)
Subjective: Darlene Lawson is a 61 y.o. is seen today for follow-up evaluation of ongoing foot pain the left side.  She states she is returned to work working 4 hours weekly.  She states that since doing this she is able to get there for hours but she is having pain and she does not think she can go beyond that point at this time.  She is tried different shoes and offloading still.  No recent injury or change otherwise.   Objective: General: No acute distress, AAOx3  DP/PT pulses palpable 2/4, CRT < 3 sec to all digits.  Protective sensation intact. Motor function intact.  LEFT foot: Incision is well coapted without any evidence of dehiscence and scars are well formed.  The traversing of the fourth toe which is rigid.  The superstrict majority discomfort.  No specific area pinpoint tenderness.  In general she gets pain as well as submetatarsal no specific pinpoint tenderness noted today.  MMT 5/5.  Decreased range of motion of the lesser digit MPJs as well.  No pain with calf compression, swelling, warmth, erythema.   Assessment and Plan:  Chronic left foot pain, arthritis  -Treatment options discussed including all alternatives, risks, and complications -X-rays obtained reviewed.  Arthritic changes present the lesser MPJs and digital deformity noted.  No evidence of acute fracture. -Overall she is doing about the same.  Returned to work working 4 hours once a week and I do not think that she can go beyond that at this time, to keep her at the current restrictions.  Continue shoes and offloading.  Consider steroid injection if needed to help with the metatarsalgia pain.  Discussed possible revision of the fourth toe but we mutually decided upon going off on surgery for now.  Trula Slade DPM

## 2022-02-25 ENCOUNTER — Ambulatory Visit
Admission: RE | Admit: 2022-02-25 | Discharge: 2022-02-25 | Disposition: A | Discharge status: Home / Self Care | Payer: BC Managed Care – PPO | Source: Ambulatory Visit | Attending: Physician Assistant | Admitting: Physician Assistant

## 2022-02-25 ENCOUNTER — Other Ambulatory Visit: Payer: Self-pay | Admitting: Physician Assistant

## 2022-02-25 DIAGNOSIS — M25562 Pain in left knee: Secondary | ICD-10-CM

## 2022-05-14 ENCOUNTER — Other Ambulatory Visit: Payer: Self-pay | Admitting: Podiatry

## 2022-05-17 ENCOUNTER — Encounter: Payer: Self-pay | Admitting: Podiatry

## 2022-05-17 ENCOUNTER — Ambulatory Visit (INDEPENDENT_AMBULATORY_CARE_PROVIDER_SITE_OTHER): Payer: BC Managed Care – PPO

## 2022-05-17 ENCOUNTER — Ambulatory Visit: Payer: BC Managed Care – PPO | Admitting: Podiatry

## 2022-05-17 DIAGNOSIS — M778 Other enthesopathies, not elsewhere classified: Secondary | ICD-10-CM

## 2022-05-17 DIAGNOSIS — M2042 Other hammer toe(s) (acquired), left foot: Secondary | ICD-10-CM

## 2022-05-17 MED ORDER — GABAPENTIN 300 MG PO CAPS: 300.0000 mg | ORAL_CAPSULE | Freq: Every day | ORAL | 0 refills | Status: DC

## 2022-05-24 NOTE — Progress Notes (Signed)
Subjective: Chief Complaint  Patient presents with   Routine Post Op    Rm 13  Left 4th toe pain has come back. Pt states bone structure is getting worse. Pt states she may need surgery eventually.     Darlene Lawson is a 62 y.o. is seen today for follow-up evaluation of ongoing foot pain the left side.  She is still working 4 hours/week she states after sitting for that time she is getting pain to her feet.  She states that the left fourth toe is going to have surgery at some point but she is not ready to have it done yet.  No injuries or changes otherwise.  Objective: General: No acute distress, AAOx3  DP/PT pulses palpable 2/4, CRT < 3 sec to all digits.  Protective sensation intact. Motor function intact.  LEFT foot: Incision is well coapted without any evidence of dehiscence and scars are well formed.  Adductovarus noted to the left fourth toe which is rigid.  There is also adductovarus noted to the fifth toe.  There is no open lesions.  Other toes are rectus. Decreased range of motion of the lesser digit MPJs as well. No pain with calf compression, swelling, warmth, erythema.   Assessment and Plan:  Chronic left foot pain, arthritis  -Treatment options discussed including all alternatives, risks, and complications -We discussed with conservative as well as surgical treatment options.  She is not yet ready to proceed with surgery so for now and continue with offloading, shoes, good arch support.  Given her continuation of discomfort we will continue her with working 4 hours/week.  Return in about 3 months (around 08/15/2022).  Trula Slade DPM

## 2022-07-01 ENCOUNTER — Ambulatory Visit: Payer: BC Managed Care – PPO | Admitting: Cardiology

## 2022-07-13 ENCOUNTER — Telehealth: Payer: Self-pay

## 2022-07-13 NOTE — Telephone Encounter (Signed)
Left message to call back to schedule a tele pre op appt.  ?

## 2022-07-13 NOTE — Telephone Encounter (Signed)
   Name: Darlene Lawson  DOB: 05-15-1960  MRN: [id number]  Primary Cardiologist: None  Chart reviewed as part of pre-operative protocol coverage. Because of Xitlally Chamberland's past medical history and time since last visit, she will require a follow-up telephone visit in order to better assess preoperative cardiovascular risk.  Pre-op covering staff: - Please schedule appointment and call patient to inform them. If patient already had an upcoming appointment within acceptable timeframe, please add "pre-op clearance" to the appointment notes so provider is aware. - Please contact requesting surgeon's office via preferred method (i.e, phone, fax) to inform them of need for appointment prior to surgery.  Patient is on ASA for mild CAD. Okay to hold for 5-7 days prior to extractions and resume as soon as medically safe to do so.   Sharlene Dory, PA-C  07/13/2022, 1:11 PM

## 2022-07-13 NOTE — Telephone Encounter (Signed)
  Patient Consent for Virtual Visit         Darlene Lawson has provided verbal consent on 07/13/2022 for a virtual visit (video or telephone).   CONSENT FOR VIRTUAL VISIT FOR:  Darlene Lawson  By participating in this virtual visit I agree to the following:  I hereby voluntarily request, consent and authorize [facility] and its employed or contracted physicians, physician assistants, nurse practitioners or other licensed health care professionals (the Practitioner), to provide me with telemedicine health care services (the "Services") as deemed necessary by the treating Practitioner. I acknowledge and consent to receive the Services by the Practitioner via telemedicine. I understand that the telemedicine visit will involve communicating with the Practitioner through live audiovisual communication technology and the disclosure of certain medical information by electronic transmission. I acknowledge that I have been given the opportunity to request an in-person assessment or other available alternative prior to the telemedicine visit and am voluntarily participating in the telemedicine visit.  I understand that I have the right to withhold or withdraw my consent to the use of telemedicine in the course of my care at any time, without affecting my right to future care or treatment, and that the Practitioner or I may terminate the telemedicine visit at any time. I understand that I have the right to inspect all information obtained and/or recorded in the course of the telemedicine visit and may receive copies of available information for a reasonable fee.  I understand that some of the potential risks of receiving the Services via telemedicine include:  Delay or interruption in medical evaluation due to technological equipment failure or disruption; Information transmitted may not be sufficient (e.g. poor resolution of images) to allow for appropriate medical decision making by the Practitioner;  and/or  In rare instances, security protocols could fail, causing a breach of personal health information.  Furthermore, I acknowledge that it is my responsibility to provide information about my medical history, conditions and care that is complete and accurate to the best of my ability. I acknowledge that Practitioner's advice, recommendations, and/or decision may be based on factors not within their control, such as incomplete or inaccurate data provided by me or distortions of diagnostic images or specimens that may result from electronic transmissions. I understand that the practice of medicine is not an exact science and that Practitioner makes no warranties or guarantees regarding treatment outcomes. I acknowledge that a copy of this consent can be made available to me via my patient portal [facility] MyChart), or I can request a printed copy by calling the office of [facility].    I understand that my insurance will be billed for this visit.   I have read or had this consent read to me. I understand the contents of this consent, which adequately explains the benefits and risks of the Services being provided via telemedicine.  I have been provided ample opportunity to ask questions regarding this consent and the Services and have had my questions answered to my satisfaction. I give my informed consent for the services to be provided through the use of telemedicine in my medical care

## 2022-07-13 NOTE — Telephone Encounter (Signed)
Patient is scheduled for tele visit on 07/21/22. Med rec was not confirmed due to patient being unsure of her medications. Pt states that she has a list of her medications but she was not near it at the time. I informed patient to have the list present for her tele visit. Consent done

## 2022-07-13 NOTE — Telephone Encounter (Signed)
   Pre-operative Risk Assessment    Patient Name: Darlene Lawson  DOB: [birth year] MRN: [id number]     Request for Surgical Clearance    Procedure:  Dental Extraction - Amount of Teeth to be Pulled:  Extraction of four (4) teeth  Date of Surgery:  Clearance TBD                                 Surgeon:  No Indicated Surgeon's Group or Practice Name:  [facility]  Phone number:  [phone] Fax number:  [phone]   Type of Clearance Requested:   - Medical Medical and Pharmacy   Type of Anesthesia:   Versed and Fentanyl   Additional requests/questions:  Please fax a copy of requested records and signed request form  to the surgeon's office.  [name]   07/13/2022, 12:59 PM

## 2022-07-18 NOTE — Telephone Encounter (Signed)
I called the [facility] office back. See previous notes from call today. I informed [facility] oofice the pt has tele appt 07/21/22 @9:40. Once the pt has been cleared we will fax clearance notes.   

## 2022-07-18 NOTE — Telephone Encounter (Signed)
Caller is following-up on the status of patient's clearance. 

## 2022-07-18 NOTE — Telephone Encounter (Signed)
I called the [facility] office back. See previous notes from call today. I informed [facility] oofice the pt has tele appt 07/21/22 :40. Once the pt has been cleared we will fax clearance notes.

## 2022-07-21 ENCOUNTER — Ambulatory Visit: Payer: BC Managed Care – PPO | Attending: Cardiology | Admitting: Nurse Practitioner

## 2022-07-21 DIAGNOSIS — Z01810 Encounter for preprocedural cardiovascular examination: Secondary | ICD-10-CM

## 2022-07-21 NOTE — Progress Notes (Signed)
Virtual Visit via Telephone Note   Because of Darlene Lawson's co-morbid illnesses, she is at least at moderate risk for complications without adequate follow up.  This format is felt to be most appropriate for this patient at this time.  The patient did not have access to video technology/had technical difficulties with video requiring transitioning to audio format only (telephone).  All issues noted in this document were discussed and addressed.  No physical exam could be performed with this format.  Please refer to the patient's chart for her consent to telehealth for [facility].  Evaluation Performed:  Preoperative cardiovascular risk assessment _____________   Date:  07/21/2022   Patient ID:  Darlene Lawson, DOB 10/06/1960, MRN [id number] Patient Location:  Home Provider location:   Office  Primary Care Provider:  Street, Stephanie Coup, MD Primary Cardiologist:  None  Chief Complaint / Patient Profile   62 y.o. y/o female with a h/o nonobstructive CAD, chronic diastolic heart failure, paroxysmal atrial fibrillation not on anticoagulation, dyslipidemia, OSA, and hypothyroidism who is pending dental extraction (4 teeth) with [facility] and presents today for telephonic preoperative cardiovascular risk assessment.  History of Present Illness    [name] is a 62 y.o. female who presents via audio/video conferencing for a telehealth visit today.  Pt was last seen in cardiology clinic on 10/28/2021 by Dr. Dulce Sellar.  At that time Ryian Lynde was doing well. The patient is now pending procedure as outlined above. Since her last visit, she states that she has had multiple episodes of atrial fibrillation.  She thinks she is currently in atrial fibrillation.  Patient is requesting an office visit for further evaluation prior to surgical clearance.  Past Medical History    Past Medical History:  Diagnosis Date   Acquired thrombophilia (HCC)     Allergic rhinitis with postnasal drip    Anxiety    Aortic atherosclerosis (HCC)    Cellulitis of right lower extremity without foot    Chronic insomnia    Chronic respiratory failure with hypoxia (HCC)    DOE (dyspnea on exertion)    Erythema multiforme    GERD (gastroesophageal reflux disease)    Hammertoe 06/18/2015   History of erythema multiforme 12/18/2019   Hypothyroidism    Ingrown nail 03/15/2015   Lower leg DVT (deep venous thromboembolism), acute, right (HCC)    Major depressive disorder with single episode    Menopausal problem    Mild persistent asthma, uncomplicated    Morbid obesity (HCC)    Morton's neuroma of left foot 12/26/2012   Second and third intersoace neuromas noted.      Oral lichen planus 02/13/2018   OSA (obstructive sleep apnea)    Osteoarthritis    Overactive bladder    Pain in joint, ankle and foot 12/26/2012   Plantar fasciitis, left 12/26/2012   Managed with orthoses and NSAIDS    Plantar flexed metatarsal 06/18/2015   Radiculopathy, cervical region 05/01/2020   Restless leg syndrome    Spinal stenosis in cervical region 03/30/2020   Supplemental oxygen dependent    Surgery follow-up 06/18/2015   Past Surgical History:  Procedure Laterality Date   bladder tack     fibroid tumor     LAMINECTOMY  02/2013   SALIVARY GLAND SURGERY  01/17/2017   removed    Allergies  Allergies  Allergen Reactions   Penicillins Hives   Clindamycin     Other reaction(s): GI intolerance   Prednisone  Other (See Comments)    Contraindicated while on MTX per Dr. Reche Dixon (dermatologist)   Sulfa Antibiotics Rash    Home Medications    Prior to Admission medications   Medication Sig Start Date End Date Taking? Authorizing Provider  albuterol (VENTOLIN HFA) 108 (90 Base) MCG/ACT inhaler Inhale 2 puffs into the lungs every 6 (six) hours as needed for wheezing or shortness of breath. 08/31/20   [provider]  ascorbic acid (VITAMIN C) 500 MG tablet Take 500 mg  by mouth daily.    [provider]  aspirin EC 81 MG tablet Take 1 tablet (81 mg total) by mouth daily. Swallow whole. 04/28/21   Baldo Daub, MD  Sherrie Sport COVID-19 AG HOME TEST KIT Use as Directed on the Package 11/12/20   [provider]  Biotin 5 MG TBDP Take 5 mg by mouth daily.    [provider]  buPROPion (WELLBUTRIN XL) 300 MG 24 hr tablet Take 300 mg by mouth daily. 08/18/20   [provider]  Calcium Carb-Cholecalciferol 600-400 MG-UNIT TABS Take 1 tablet by mouth daily.    [provider]  calcium carbonate (OSCAL) 1500 (600 Ca) MG TABS tablet Take 600 mg of elemental calcium by mouth daily with breakfast.    [provider]  Calcium Carbonate-Vit D-Min (CALCIUM 600+D PLUS MINERALS) 600-400 MG-UNIT TABS Take 1 tablet by mouth daily.    [provider]  celecoxib (CELEBREX) 200 MG capsule Take 200 mg by mouth daily. 12/14/20   [provider]  Cholecalciferol 50 MCG (2000 UT) CAPS Take 2,000 Units by mouth daily.    [provider]  clobetasol cream (TEMOVATE) 0.05 % Apply 1 application topically 2 (two) times daily as needed (Rash).    [provider]  clorazepate (TRANXENE) 3.75 MG tablet Take 3.75 mg by mouth at bedtime as needed for anxiety. 09/28/12   [provider]  clotrimazole (MYCELEX) 10 MG troche Take 10 mg by mouth daily as needed (Mouth sore). 04/20/20   [provider]  cycloSPORINE (SANDIMMUNE) 100 MG capsule Take 100 mg by mouth daily as needed (Breakout). 12/18/19   [provider]  diclofenac Sodium (VOLTAREN) 1 % GEL SMARTSIG:4 Gram(s) Topical 1-4 Times Daily 11/11/20   [provider]  esomeprazole (NEXIUM) 20 MG capsule Take 20 mg by mouth daily at 12 noon.    [provider]  estradiol-norethindrone (ACTIVELLA) 1-0.5 MG tablet Take 1 tablet by mouth daily. 01/04/21   [provider]  finasteride (PROSCAR) 5 MG tablet Take 5 mg by  mouth daily. 06/30/20   [provider]  FLOVENT DISKUS 100 MCG/ACT AEPB Take 1 puff by mouth 2 (two) times daily. 10/15/21   [provider]  fluconazole (DIFLUCAN) 150 MG tablet Take by mouth. 11/11/20   [provider]  fluticasone (FLONASE) 50 MCG/ACT nasal spray Place 2 sprays into both nostrils daily as needed for allergies or rhinitis. 11/17/12   [provider]  Fluticasone-Umeclidin-Vilant 100-62.5-25 MCG/ACT AEPB Inhale 1 puff into the lungs daily. 08/06/20   [provider]  folic acid (FOLVITE) 1 MG tablet Take 1 mg by mouth daily. 05/14/20   [provider]  gabapentin (NEURONTIN) 300 MG capsule Take 1 capsule (300 mg total) by mouth at bedtime. 05/17/22   Vivi Barrack, DPM  ketoconazole (NIZORAL) 2 % shampoo Apply 1 Application topically 3 (three) times a week. 08/16/21   [provider]  [place] Lanius Ultra Strength 1500 MG CAPS Take  1 capsule by mouth daily.    [provider]  levothyroxine (SYNTHROID) 200 MCG tablet Take 200 mcg by mouth daily. 12/08/20   [provider]  loratadine-pseudoephedrine (CLARITIN-D 24-HOUR) 10-240 MG 24 hr tablet Take 1 tablet by mouth daily as needed for allergies.    [provider]  Magnesium 250 MG TABS Take 250 mg by mouth 2 (two) times daily.    [provider]  methotrexate (RHEUMATREX) 2.5 MG tablet Take 10 mg by mouth once a week. Sunday's 03/12/20   [provider]  metroNIDAZOLE (METROCREAM) 0.75 % cream Apply topically 2 (two) times daily. 12/23/20   [provider]  metroNIDAZOLE (METROGEL) 1 % gel Apply 1 application topically daily.    [provider]  ondansetron (ZOFRAN) 4 MG tablet Take 1 tablet (4 mg total) by mouth every 8 (eight) hours as needed for nausea or vomiting. 11/18/20   Vivi Barrack, DPM  polyethylene glycol (MIRALAX / GLYCOLAX) 17 g packet Take 17 g by mouth 2 (two) times daily.    [provider]  potassium chloride (KLOR-CON) 10 MEQ tablet Take 1 tablet by mouth once daily 11/09/21   Baldo Daub, MD  pravastatin (PRAVACHOL) 20 MG tablet TAKE 1 TABLET BY MOUTH ONCE DAILY IN THE EVENING 11/09/21   Baldo Daub, MD  rOPINIRole (REQUIP) 2 MG tablet Take by mouth. 12/14/20   [provider]  tacrolimus (PROGRAF) 1 MG capsule Take 1 mg by mouth every 12 (twelve) hours as needed (Breakout rash / mout sore). 11/13/17   [provider]  torsemide (DEMADEX) 20 MG tablet Take 1 tablet by mouth twice daily 08/11/21   Baldo Daub, MD  traZODone (DESYREL) 100 MG tablet Take 100 mg by mouth at bedtime. 12/14/20   [provider]  VESICARE 10 MG tablet Take 10 mg by mouth at bedtime. 12/07/12   [provider]  vitamin B-12 (CYANOCOBALAMIN) 500 MCG tablet Take 500 mcg by mouth daily.    [provider]  vitamin E 180 MG (400 UNITS) capsule Take 400 Units by mouth daily.    [provider]    Physical Exam    Vital Signs:  Delphine Sizemore does not have vital signs available for review today.  Given telephonic nature of communication, physical exam is limited. AAOx3. NAD. Normal affect.  Speech and respirations are unlabored.  Accessory Clinical Findings    None  Assessment & Plan    1.  Preoperative Cardiovascular Risk Assessment:  Patient notes she thinks she may be in atrial fibrillation and is requesting an office visit prior to surgical clearance.  Therefore, I am unable to provide clearance today.  I will reach out to our preop coverage team to have patient scheduled for office visit.  Patient insists on seeing Dr. Dulce Sellar (not APP).  Discussed ED precautions.  Patient verbalized understanding.   A copy of this note will be routed to requesting surgeon.  Time:   Today, I have spent 5 minutes with the patient with telehealth technology discussing medical history, symptoms, and management plan.     Joylene Grapes,  NP  07/21/2022, 9:52 AM

## 2022-07-21 NOTE — Progress Notes (Signed)
I will send a message to Dr. Dulce Sellar scheduling team to call the pt with an appt with Dr. Dulce Sellar.

## 2022-08-15 ENCOUNTER — Ambulatory Visit: Payer: BC Managed Care – PPO | Admitting: Podiatry

## 2022-08-15 DIAGNOSIS — M2042 Other hammer toe(s) (acquired), left foot: Secondary | ICD-10-CM

## 2022-08-15 DIAGNOSIS — M216X2 Other acquired deformities of left foot: Secondary | ICD-10-CM | POA: Diagnosis not present

## 2022-08-15 DIAGNOSIS — M778 Other enthesopathies, not elsewhere classified: Secondary | ICD-10-CM

## 2022-08-17 NOTE — Progress Notes (Unsigned)
Cardiology Office Note:    Date:  08/18/2022   ID:  Adrinna Washington, DOB 1960-12-19, MRN [id number]  PCP:  Street, Stephanie Coup, MD  Cardiologist:  Norman Herrlich, MD    Referring MD: [address] Stephanie Coup, *    ASSESSMENT:    1. Preop cardiovascular exam   2. Chronic diastolic heart failure (HCC)   3. PAF (paroxysmal atrial fibrillation) (HCC)   4. Chronic anticoagulation   5. Mild CAD   6. Dyslipidemia    PLAN:    In order of problems listed above:  Optimized proceed with her planned dental extraction WITH Compensated switch torsemide to furosemide her current she thinks it is easier to tolerate her urinary frequency Reached labs including CBC CMP lipids continue current lipid-lowering treatment Continue low-dose aspirin and her current statin check lipid profile If atrial fibrillation is documented she will need anticoagulation Note she takes Adderall may contribute to arrhythmia as we discussed with her PCP   Next appointment: 6 months   Medication Adjustments/Labs and Tests Ordered: Current medicines are reviewed at length with the patient today.  Concerns regarding medicines are outlined above.  No orders of the defined types were placed in this encounter.  No orders of the defined types were placed in this encounter.     History of Present Illness:    Railey Hedgpeth is a 62 y.o. female with a hx of chronic diastolic heart failure EF 55 to 60% obstructive sleep apnea mild CAD DVT in January 2022 associated with COVID-19 infection and paroxysmal atrial fibrillation working critical illness COVID-19 infection last seen 10/28/2021. Compliance with diet, lifestyle and medications: Yes  She is going to have 4 teeth extracted oral surgeon tells me with Versed and fentanyl.  Minor procedure she is optimized from a cardiology perspective She request to take furosemide she thinks it works better Intermittently has edema not short of breath chest pain or syncope Has had a  few episodes of palpitation tells me that her watch tells her she has atrial fibrillation but she has not captured the events Longest 45 minutes Without documented atrial fibrillation I would not put her on an anticoagulant will apply a ZIO monitor for 1 week and I asked her to capture episodes in the future Her last labs were last October we will update today with his CBC CMP lipids and pro BNP EKG in office today shows sinus rhythm poor R wave progression late transition Past Medical History:  Diagnosis Date   Acquired thrombophilia (HCC)    Allergic rhinitis with postnasal drip    Anxiety    Aortic atherosclerosis (HCC)    Cellulitis of right lower extremity without foot    Chronic insomnia    Chronic respiratory failure with hypoxia (HCC)    DOE (dyspnea on exertion)    Erythema multiforme    GERD (gastroesophageal reflux disease)    Hammertoe 06/18/2015   History of erythema multiforme 12/18/2019   Hypothyroidism    Ingrown nail 03/15/2015   Lower leg DVT (deep venous thromboembolism), acute, right (HCC)    Major depressive disorder with single episode    Menopausal problem    Mild persistent asthma, uncomplicated    Morbid obesity (HCC)    Morton's neuroma of left foot 12/26/2012   Second and third intersoace neuromas noted.      Oral lichen planus 02/13/2018   OSA (obstructive sleep apnea)    Osteoarthritis    Overactive bladder    Pain in joint, ankle and foot 12/26/2012  Plantar fasciitis, left 12/26/2012   Managed with orthoses and NSAIDS    Plantar flexed metatarsal 06/18/2015   Radiculopathy, cervical region 05/01/2020   Restless leg syndrome    Spinal stenosis in cervical region 03/30/2020   Supplemental oxygen dependent    Surgery follow-up 06/18/2015    Past Surgical History:  Procedure Laterality Date   bladder tack     fibroid tumor     LAMINECTOMY  02/2013   SALIVARY GLAND SURGERY  01/17/2017   removed    Current Medications: Current Meds  Medication Sig    albuterol (VENTOLIN HFA) 108 (90 Base) MCG/ACT inhaler Inhale 2 puffs into the lungs every 6 (six) hours as needed for wheezing or shortness of breath.   amphetamine-dextroamphetamine (ADDERALL XR) 20 MG 24 hr capsule Take 20 mg by mouth daily.   ascorbic acid (VITAMIN C) 500 MG tablet Take 500 mg by mouth daily.   aspirin EC 81 MG tablet Take 1 tablet (81 mg total) by mouth daily. Swallow whole.   BINAXNOW COVID-19 AG HOME TEST KIT Use as Directed on the Package   Biotin 5 MG TBDP Take 5 mg by mouth daily.   buPROPion (WELLBUTRIN XL) 300 MG 24 hr tablet Take 300 mg by mouth daily.   Calcium Carb-Cholecalciferol 600-400 MG-UNIT TABS Take 1 tablet by mouth daily.   celecoxib (CELEBREX) 200 MG capsule Take 200 mg by mouth 2 (two) times daily.   Cholecalciferol 125 MCG (5000 UT) TABS Take 5,000 Units by mouth daily.   clobetasol cream (TEMOVATE) 0.05 % Apply 1 application topically 2 (two) times daily as needed (Rash).   clorazepate (TRANXENE) 3.75 MG tablet Take 3.75 mg by mouth at bedtime as needed for anxiety.   clotrimazole (MYCELEX) 10 MG troche Take 10 mg by mouth daily as needed (Mouth sore).   COLLAGEN PO Take 3 tablets by mouth daily.   cycloSPORINE (SANDIMMUNE) 100 MG capsule Take 100 mg by mouth daily as needed (Breakout).   diclofenac Sodium (VOLTAREN) 1 % GEL SMARTSIG:4 Gram(s) Topical 1-4 Times Daily   esomeprazole (NEXIUM) 20 MG capsule Take 20 mg by mouth 2 (two) times daily before a meal.   estradiol-norethindrone (ACTIVELLA) 1-0.5 MG tablet Take 1 tablet by mouth daily.   finasteride (PROSCAR) 5 MG tablet Take 5 mg by mouth daily.   FLOVENT DISKUS 100 MCG/ACT AEPB Take 1 puff by mouth 2 (two) times daily.   fluconazole (DIFLUCAN) 150 MG tablet Take 150 mg by mouth once as needed (Yeast Infection).   fluticasone (FLONASE) 50 MCG/ACT nasal spray Place 2 sprays into both nostrils daily as needed for allergies or rhinitis.   folic acid (FOLVITE) 1 MG tablet Take 1 mg by mouth  daily.   gabapentin (NEURONTIN) 300 MG capsule Take 1 capsule (300 mg total) by mouth at bedtime.   ketoconazole (NIZORAL) 2 % shampoo Apply 1 Application topically 3 (three) times a week.   Krill Oil Ultra Strength 1500 MG CAPS Take 1 capsule by mouth daily.   levothyroxine (SYNTHROID) 200 MCG tablet Take 200 mcg by mouth daily.   loratadine-pseudoephedrine (CLARITIN-D 24-HOUR) 10-240 MG 24 hr tablet Take 1 tablet by mouth daily as needed for allergies.   Magnesium 250 MG TABS Take 250 mg by mouth 2 (two) times daily.   methotrexate (RHEUMATREX) 2.5 MG tablet Take 2.5 mg by mouth once a week. Take 3 tablets (7.5 mg) Sunday's   polyethylene glycol (MIRALAX / GLYCOLAX) 17 g packet Take 17 g by mouth 2 (two)  times daily.   potassium chloride (KLOR-CON) 10 MEQ tablet Take 1 tablet by mouth once daily   pravastatin (PRAVACHOL) 20 MG tablet TAKE 1 TABLET BY MOUTH ONCE DAILY IN THE EVENING   rOPINIRole (REQUIP) 2 MG tablet Take 2 mg by mouth 2 (two) times daily.   tacrolimus (PROGRAF) 1 MG capsule Take 1 mg by mouth every 12 (twelve) hours as needed (Breakout rash / mout sore).   torsemide (DEMADEX) 20 MG tablet Take 1 tablet by mouth twice daily   traZODone (DESYREL) 100 MG tablet Take 100 mg by mouth at bedtime.   VESICARE 10 MG tablet Take 10 mg by mouth at bedtime.   vitamin B-12 (CYANOCOBALAMIN) 500 MCG tablet Take 500 mcg by mouth daily.   vitamin E 180 MG (400 UNITS) capsule Take 400 Units by mouth daily.   Zinc 50 MG TABS Take 1 tablet by mouth daily.     Allergies:   Penicillins, Clindamycin, Prednisone, and Sulfa antibiotics   Social History   Socioeconomic History   Marital status: Married    Spouse name: Not on file   Number of children: Not on file   Years of education: Not on file   Highest education level: Not on file  Occupational History   Not on file  Tobacco Use   Smoking status: Former    Packs/day: 1.50    Years: 20.00    Additional pack years: 0.00    Total pack  years: 30.00    Types: Cigarettes    Quit date: 7    Years since quitting: 31.4   Smokeless tobacco: Never  Substance and Sexual Activity   Alcohol use: No   Drug use: No   Sexual activity: Not on file  Other Topics Concern   Not on file  Social History Narrative   Not on file   Social Determinants of Health   Financial Resource Strain: Not on file  Food Insecurity: Not on file  Transportation Needs: Not on file  Physical Activity: Not on file  Stress: Not on file  Social Connections: Not on file     Family History: The patient's family history includes COPD in her father; Diabetes in her brother, father, mother, and sister; Heart disease in her mother; Lung cancer in her father; Stroke in her father. ROS:   Please see the history of present illness.    All other systems reviewed and are negative.  EKGs/Labs/Other Studies Reviewed:    The following studies were reviewed today:  Cardiac Studies & Procedures         MONITORS  LONG TERM MONITOR (3-14 DAYS) 09/11/2020  Narrative Patch Wear Time:  7 days and 0 hours (2022-06-06T16:31:06-398 to 2022-06-13T17:06:03-398)  Patient had a min HR of 59 bpm, max HR of 150 bpm, and avg HR of 81 bpm. Predominant underlying rhythm was Sinus Rhythm. First Degree AV Block was present. 12 Supraventricular Tachycardia runs occurred, the run with the fastest interval lasting 5 beats with a max rate of 150 bpm, the longest lasting 8 beats with an avg rate of 118 bpm. Supraventricular Tachycardia was detected within +/- 45 seconds of symptomatic patient event(s). Isolated SVEs were rare (<1.0%), SVE Couplets were rare (<1.0%), and SVE Triplets were rare (<1.0%). Isolated VEs were rare (<1.0%), and no VE Couplets or VE Triplets were present.  There were rare for triggered events all sinus rhythm 1 associated with a 4 beat run of atrial premature contractions. There were 12 brief runs of atrial  premature contractions the longest 8  complexes at a rate of 118 bpm. Supraventricular ectopy was rare with no episodes of atrial fibrillation or flutter Ventricular ectopy was rare There were no pauses of more than 3 seconds and no episodes of second or third-degree AV nodal block or sinus node exit block.   Conclusion rare ventricular and supraventricular ectopy.   CT SCANS  CT CORONARY MORPH W/CTA COR W/SCORE 09/08/2020  Addendum 09/08/2020  5:02 PM ADDENDUM REPORT: 09/08/2020 17:00  HISTORY: Chest pain/anginal equiv, 35yr CHD risk > 20%, not treadmill candidate  EXAM: Cardiac/Coronary  CT  TECHNIQUE: The patient was scanned on a [employer].  PROTOCOL: A 120 kV prospective scan was triggered in the descending thoracic aorta at 111 HU's. Axial non-contrast 3 mm slices were carried out through the heart. The data set was analyzed on a dedicated work station and scored using the Agatston method. Gantry rotation speed was 250 msecs and collimation was .6 mm. Beta blockade and 0.8 mg of sl NTG was given. The 3D data set was reconstructed in 5% intervals of the 35-75 % of the R-R cycle. Systolic and diastolic phases were analyzed on a dedicated work station using MPR, MIP and VRT modes. The patient received OMNIPAQUE IOHEXOL 350 MG/ML SOLN contrast.  FINDINGS: Image quality: Average, poor contrast bolus timing.  Artifact: Limited.  Coronary calcium score is 16, which places the patient in the 72nd percentile for age and sex matched control.  Coronary arteries: Normal coronary origins.  Right dominance.  Right Coronary Artery: No detectable plaque or stenosis.  Left Main Coronary Artery: No detectable plaque or stenosis.  Left Anterior Descending Coronary Artery: Mild mixed atherosclerotic plaque in the proximal LAD, 25-49% stenosis. No detectable plaque or stenosis in the mid LAD. Distal LAD is not well visualized but appears patent.  Left Circumflex Artery: No detectable plaque or  stenosis.  Aorta: Normal size, 33 mm at the mid ascending aorta (level of the PA bifurcation) measured double oblique. No calcifications. No dissection.  Aortic Valve: Trivial calcifications.  Other findings:  Normal pulmonary vein drainage into the left atrium.  Normal left atrial appendage without a thrombus.  Normal size of the pulmonary artery.  IMPRESSION: 1.  Mild CAD in proximal LAD, CADRADS = 2.  2. Coronary calcium score is 16, which places the patient in the 72nd percentile for age and sex matched control.  3. Normal coronary origin with right dominance.  4.  LVEF approximately 58%.   Electronically Signed By: Weston Brass On: 09/08/2020 17:00  Narrative EXAM: OVER-READ INTERPRETATION  CT CHEST  The following report is an over-read performed by radiologist Dr. Trudie Reed of [facility] on 09/08/2020. This over-read does not include interpretation of cardiac or coronary anatomy or pathology. The coronary calcium score/coronary CTA interpretation by the cardiologist is attached.  COMPARISON:  None.  FINDINGS: Within the visualized portions of the thorax there are no suspicious appearing pulmonary nodules or masses, there is no acute consolidative airspace disease, no pleural effusions, no pneumothorax and no lymphadenopathy. Visualized portions of the upper abdomen demonstrates several subcentimeter low-attenuation lesions in the liver, too small to characterize, but statistically likely to represent small cysts. There are no aggressive appearing lytic or blastic lesions noted in the visualized portions of the skeleton. Old partially healed left-sided lateral rib fracture incidentally noted.  IMPRESSION: 1. No significant incidental noncardiac findings are noted.  Electronically Signed: By: Trudie Reed M.D. On: 09/08/2020 16:10  Physical Exam:    VS:  BP 120/78 (BP Location: Right Arm, Patient Position:  Sitting)   Pulse 79   Ht 5\' 3"  (1.6 m)   Wt 248 lb (112.5 kg)   SpO2 97%   BMI 43.93 kg/m     Wt Readings from Last 3 Encounters:  08/18/22 248 lb (112.5 kg)  10/28/21 242 lb (109.8 kg)  04/28/21 244 lb 9.6 oz (110.9 kg)     GEN: Anxious well nourished, well developed in no acute distress HEENT: Normal NECK: No JVD; No carotid bruits LYMPHATICS: No lymphadenopathy CARDIAC: RRR, no murmurs, rubs, gallops RESPIRATORY:  Clear to auscultation without rales, wheezing or rhonchi  ABDOMEN: Soft, non-tender, non-distended MUSCULOSKELETAL:  No edema; No deformity  SKIN: Warm and dry NEUROLOGIC:  Alert and oriented x 3 PSYCHIATRIC:  Normal affect    Signed, Norman Herrlich, MD  08/18/2022 10:45 AM    [facility]

## 2022-08-18 ENCOUNTER — Ambulatory Visit: Payer: BC Managed Care – PPO | Attending: Cardiology | Admitting: Cardiology

## 2022-08-18 ENCOUNTER — Encounter: Payer: Self-pay | Admitting: Cardiology

## 2022-08-18 VITALS — BP 120/78 | HR 79 | Ht 63.0 in | Wt 248.0 lb

## 2022-08-18 DIAGNOSIS — Z7901 Long term (current) use of anticoagulants: Secondary | ICD-10-CM | POA: Diagnosis not present

## 2022-08-18 DIAGNOSIS — I480 Paroxysmal atrial fibrillation: Secondary | ICD-10-CM

## 2022-08-18 DIAGNOSIS — Z01810 Encounter for preprocedural cardiovascular examination: Secondary | ICD-10-CM | POA: Diagnosis not present

## 2022-08-18 DIAGNOSIS — I5032 Chronic diastolic (congestive) heart failure: Secondary | ICD-10-CM | POA: Diagnosis not present

## 2022-08-18 DIAGNOSIS — I2510 Atherosclerotic heart disease of native coronary artery without angina pectoris: Secondary | ICD-10-CM

## 2022-08-18 DIAGNOSIS — E785 Hyperlipidemia, unspecified: Secondary | ICD-10-CM

## 2022-08-18 MED ORDER — FUROSEMIDE 40 MG PO TABS: 40.0000 mg | ORAL_TABLET | Freq: Two times a day (BID) | ORAL | 3 refills | Status: DC

## 2022-08-18 NOTE — Progress Notes (Signed)
Subjective: Chief Complaint  Patient presents with   Follow-up    Left second toe has been swelling and throbbing. Still having issues with left fourth toe.     Darlene Lawson is a 62 y.o. is seen today for follow-up evaluation of ongoing foot pain the left side.  She states that she is still having pain to the foot and is being some pain in the second toe.  She does not report any recent injuries.  She is still working 4 hours per week and she states that after that her shift her feet are hurting and she does not feel that she can work anymore at this time.  No recent injury or changes.    Objective: General: No acute distress, AAOx3  DP/PT pulses palpable 2/4, CRT < 3 sec to all digits.  Protective sensation intact. Motor function intact.  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  There is also adductovarus noted to the fifth toe.  There is some prominence noted along the medial aspect of the second toe.  Toe is rectus.  There is no open lesions.  Other toes are rectus. Decreased range of motion of the lesser digit MPJs as well. Minimal callus on the submetatarsal 2 but no significant tissue to debride today. No pain with calf compression, swelling, warmth, erythema.   Assessment and Plan:  Chronic left foot pain, arthritis  -Treatment options discussed including all alternatives, risks, and complications -We discussed with conservative as well as surgical treatment options.  She does not proceed with surgery at this time.  Will continue the current work restrictions for 4 hours/week given her ongoing foot pain I agree with this.  Continue with shoes, good arch support.  If her symptoms improve we can consider increasing her time at work but for now we will keep it the same.   -Moisturizer on the callus.  Continue offloading.  Return in about 3 months (around 11/15/2022).  Vivi Barrack DPM

## 2022-08-18 NOTE — Addendum Note (Signed)
Addended by: Roxanne Mins I on: 08/18/2022 11:05 AM   Modules accepted: Orders

## 2022-08-18 NOTE — Patient Instructions (Signed)
Medication Instructions:  Your physician has recommended you make the following change in your medication:   STOP: Torsemide START: Furosemide 40 mg twice daily  *If you need a refill on your cardiac medications before your next appointment, please call your pharmacy*   Lab Work: Your physician recommends that you return for lab work in:   Labs today: CMP, Lipid, Pro BNP, CBC  If you have labs (blood work) drawn today and your tests are completely normal, you will receive your results only by: MyChart Message (if you have MyChart) OR A paper copy in the mail If you have any lab test that is abnormal or we need to change your treatment, we will call you to review the results.   Testing/Procedures: None   Follow-Up: At [facility], you and your health needs are our priority.  As part of our continuing mission to provide you with exceptional heart care, we have created designated Provider Care Teams.  These Care Teams include your primary Cardiologist (physician) and Advanced Practice Providers (APPs -  Physician Assistants and Nurse Practitioners) who all work together to provide you with the care you need, when you need it.  We recommend signing up for the patient portal called "MyChart".  Sign up information is provided on this After Visit Summary.  MyChart is used to connect with patients for Virtual Visits (Telemedicine).  Patients are able to view lab/test results, encounter notes, upcoming appointments, etc.  Non-urgent messages can be sent to your provider as well.   To learn more about what you can do with MyChart, go to [email or web address].    Your next appointment:   6 month(s)  Provider:   Norman Herrlich, MD    Other Instructions If recurrent A-fib capture and send via MyChart.

## 2022-08-19 LAB — CBC
Hematocrit: 41.2 % (ref 34.0–46.6)
Hemoglobin: 13.8 g/dL (ref 11.1–15.9)
MCH: 32 pg (ref 26.6–33.0)
MCHC: 33.5 g/dL (ref 31.5–35.7)
MCV: 96 fL (ref 79–97)
Platelets: 213 10*3/uL (ref 150–450)
RBC: 4.31 x10E6/uL (ref 3.77–5.28)
RDW: 12.6 % (ref 11.7–15.4)
WBC: 9.2 10*3/uL (ref 3.4–10.8)

## 2022-08-19 LAB — LIPID PANEL
Chol/HDL Ratio: 2.5 ratio (ref 0.0–4.4)
Cholesterol, Total: 126 mg/dL (ref 100–199)
HDL: 50 mg/dL (ref >39)
LDL Chol Calc (NIH): 64 mg/dL (ref 0–99)
Triglycerides: 51 mg/dL (ref 0–149)
VLDL Cholesterol Cal: 12 mg/dL (ref 5–40)

## 2022-08-19 LAB — COMPREHENSIVE METABOLIC PANEL
ALT: 26 IU/L (ref 0–32)
AST: 23 IU/L (ref 0–40)
Albumin/Globulin Ratio: 1.7 (ref 1.2–2.2)
Albumin: 3.9 g/dL (ref 3.9–4.9)
Alkaline Phosphatase: 41 IU/L — ABNORMAL LOW (ref 44–121)
BUN/Creatinine Ratio: 21 (ref 12–28)
BUN: 17 mg/dL (ref 8–27)
Bilirubin Total: 0.6 mg/dL (ref 0.0–1.2)
CO2: 26 mmol/L (ref 20–29)
Calcium: 9 mg/dL (ref 8.7–10.3)
Chloride: 102 mmol/L (ref 96–106)
Creatinine, Ser: 0.82 mg/dL (ref 0.57–1.00)
Globulin, Total: 2.3 g/dL (ref 1.5–4.5)
Glucose: 96 mg/dL (ref 70–99)
Potassium: 4.1 mmol/L (ref 3.5–5.2)
Sodium: 140 mmol/L (ref 134–144)
Total Protein: 6.2 g/dL (ref 6.0–8.5)
eGFR: 80 mL/min/{1.73_m2} (ref >59)

## 2022-08-19 LAB — PRO B NATRIURETIC PEPTIDE: NT-Pro BNP: 346 pg/mL — ABNORMAL HIGH (ref 0–287)

## 2022-08-25 ENCOUNTER — Telehealth: Payer: Self-pay | Admitting: Cardiology

## 2022-08-25 NOTE — Telephone Encounter (Signed)
Follow Up:      Caryn Bee is calling to check on the status of patient's clearance. Please fax to [phone] Att: Caryn Bee.

## 2022-08-25 NOTE — Telephone Encounter (Signed)
Faxed over Dr. Hulen Shouts progress notes to [facility].

## 2022-08-29 ENCOUNTER — Ambulatory Visit: Payer: BC Managed Care – PPO | Admitting: Cardiology

## 2022-10-13 ENCOUNTER — Ambulatory Visit (INDEPENDENT_AMBULATORY_CARE_PROVIDER_SITE_OTHER): Payer: BC Managed Care – PPO

## 2022-10-13 ENCOUNTER — Ambulatory Visit: Payer: BC Managed Care – PPO | Admitting: Podiatry

## 2022-10-13 DIAGNOSIS — S92524A Nondisplaced fracture of medial phalanx of right lesser toe(s), initial encounter for closed fracture: Secondary | ICD-10-CM | POA: Diagnosis not present

## 2022-10-13 DIAGNOSIS — M79674 Pain in right toe(s): Secondary | ICD-10-CM | POA: Diagnosis not present

## 2022-10-13 DIAGNOSIS — S9031XA Contusion of right foot, initial encounter: Secondary | ICD-10-CM | POA: Diagnosis not present

## 2022-10-13 MED ORDER — MELOXICAM 7.5 MG PO TABS
7.5000 mg | ORAL_TABLET | Freq: Every day | ORAL | 0 refills | Status: DC
Start: 2022-10-13 — End: 2022-11-08

## 2022-10-13 MED ORDER — DOXYCYCLINE HYCLATE 100 MG PO CAPS: 100.0000 mg | ORAL_CAPSULE | Freq: Two times a day (BID) | ORAL | 0 refills | Status: AC

## 2022-10-14 ENCOUNTER — Other Ambulatory Visit: Payer: Self-pay | Admitting: Podiatry

## 2022-10-14 DIAGNOSIS — M79674 Pain in right toe(s): Secondary | ICD-10-CM

## 2022-10-16 NOTE — Progress Notes (Signed)
Chief Complaint  Patient presents with   Toe Injury    Pt states she dropped a half gallon of a 5 gallon of water on her right great toe she is having lost of discoloration and drainage, throbbing pain too   HPI: 62 y.o. female presents today after dropping a 5 gallon container of water on her right forefoot.  She states that her right first and second toes took the brunt of the injury.  She is concerned because the right great toe is red and sore.  Also has some pain to the second toe.  She is concerned about the discoloration and the throbbing of the toe  Past Medical History:  Diagnosis Date   Acquired thrombophilia (HCC)    Allergic rhinitis with postnasal drip    Anxiety    Aortic atherosclerosis (HCC)    Cellulitis of right lower extremity without foot    Chronic insomnia    Chronic respiratory failure with hypoxia (HCC)    DOE (dyspnea on exertion)    Erythema multiforme    GERD (gastroesophageal reflux disease)    Hammertoe 06/18/2015   History of erythema multiforme 12/18/2019   Hypothyroidism    Ingrown nail 03/15/2015   Lower leg DVT (deep venous thromboembolism), acute, right (HCC)    Major depressive disorder with single episode    Menopausal problem    Mild persistent asthma, uncomplicated    Morbid obesity (HCC)    Morton's neuroma of left foot 12/26/2012   Second and third intersoace neuromas noted.      Oral lichen planus 02/13/2018   OSA (obstructive sleep apnea)    Osteoarthritis    Overactive bladder    Pain in joint, ankle and foot 12/26/2012   Plantar fasciitis, left 12/26/2012   Managed with orthoses and NSAIDS    Plantar flexed metatarsal 06/18/2015   Radiculopathy, cervical region 05/01/2020   Restless leg syndrome    Spinal stenosis in cervical region 03/30/2020   Supplemental oxygen dependent    Surgery follow-up 06/18/2015    Past Surgical History:  Procedure Laterality Date   bladder tack     fibroid tumor     LAMINECTOMY  02/2013   SALIVARY  GLAND SURGERY  01/17/2017   removed    Allergies  Allergen Reactions   Penicillins Hives   Clindamycin     Other reaction(s): GI intolerance   Prednisone Other (See Comments)    Contraindicated while on MTX per Dr. Reche Dixon (dermatologist)   Sulfa Antibiotics Rash   Physical Exam: There were no vitals filed for this visit.  General: The patient is alert and oriented x3 in no acute distress.  Dermatology: Skin is warm, dry and supple bilateral lower extremities. Interspaces are clear of maceration and debris.  The right hallux subungual area is erythematous with localized edema and calor present.  There is pain on palpation of this area.  Vascular: Palpable pedal pulses bilaterally. Capillary refill within normal limits.  No appreciable edema.  No erythema or calor.  Neurological: Light touch sensation grossly intact bilateral feet.   Musculoskeletal Exam: There is a slight mallet toe deformity to the right second toe.  There is pain on palpation to the DIPJ area of the second toe.  Radiographic Exam right foot 3 weightbearing views 10/16/2022:  Normal osseous mineralization.  There is no fracture seen in the right hallux.  The right second toe distal neck of the middle phalanx has a horizontal fracture seen which is in anatomic position.  There is slight compression to the fracture but otherwise in anatomic position.  There is lateral angulation of the distal phalanx at the level of the DIPJ  Assessment/Plan of Care: 1. Closed nondisplaced fracture of middle phalanx of lesser toe of right foot, initial encounter   2. Toe pain, right   3. Contusion of right foot, initial encounter      Meds ordered this encounter  Medications   doxycycline (VIBRAMYCIN) 100 MG capsule    Sig: Take 1 capsule (100 mg total) by mouth 2 (two) times daily for 10 days.    Dispense:  20 capsule    Refill:  0   meloxicam (MOBIC) 7.5 MG tablet    Sig: Take 1 tablet (7.5 mg total) by mouth daily.     Dispense:  30 tablet    Refill:  0   DG FOOT COMPLETE RIGHT  Discussed clinical findings with patient today.  Patient will be immobilized in a surgical shoe to decrease motion at the second toe while this fracture is healing.  She will be placed on doxycycline for the cellulitis in the right hallux periungual area.  She also will be placed on meloxicam to help with pain and inflammation to the toes.  Follow-up in approximately 3 to 4 weeks for recheck   Jaivion Kingsley Orland Mustard, DPM, FACFAS [facility]     [address] [place], [place] [address]                Office [phone]  Fax [phone]

## 2022-10-20 ENCOUNTER — Other Ambulatory Visit: Payer: Self-pay | Admitting: Podiatry

## 2022-10-21 ENCOUNTER — Other Ambulatory Visit: Payer: Self-pay | Admitting: Cardiology

## 2022-10-26 ENCOUNTER — Ambulatory Visit: Payer: BC Managed Care – PPO | Admitting: Podiatry

## 2022-11-02 ENCOUNTER — Ambulatory Visit (INDEPENDENT_AMBULATORY_CARE_PROVIDER_SITE_OTHER): Payer: BC Managed Care – PPO

## 2022-11-02 ENCOUNTER — Ambulatory Visit: Payer: BC Managed Care – PPO | Admitting: Podiatry

## 2022-11-02 DIAGNOSIS — S92524A Nondisplaced fracture of medial phalanx of right lesser toe(s), initial encounter for closed fracture: Secondary | ICD-10-CM

## 2022-11-02 DIAGNOSIS — L603 Nail dystrophy: Secondary | ICD-10-CM

## 2022-11-02 DIAGNOSIS — B351 Tinea unguium: Secondary | ICD-10-CM | POA: Diagnosis not present

## 2022-11-02 MED ORDER — CICLOPIROX 0.77 % EX GEL
CUTANEOUS | 0 refills | Status: DC
Start: 2022-11-02 — End: 2023-09-07

## 2022-11-02 NOTE — Progress Notes (Signed)
HPI: 62 y.o. female presents today for follow-up of right second toe middle phalanx fracture.  She notes less pain to the area.  She has been wrapping the toe to help splinted in a better position while healing.  She also noted that she would like the right great toenail removed today.  It has an abnormal shape to it and causes discomfort.  However she also notes shoe that she is going to the beach very soon.  She does not want to have to avoid getting into the pool or ocean or avoid the sand on the beach so we may end up postponing this.  As she is also requesting a topical prescription sent in for the left hallux nail fungus.  Past Medical History:  Diagnosis Date   Acquired thrombophilia (HCC)    Allergic rhinitis with postnasal drip    Anxiety    Aortic atherosclerosis (HCC)    Cellulitis of right lower extremity without foot    Chronic insomnia    Chronic respiratory failure with hypoxia (HCC)    DOE (dyspnea on exertion)    Erythema multiforme    GERD (gastroesophageal reflux disease)    Hammertoe 06/18/2015   History of erythema multiforme 12/18/2019   Hypothyroidism    Ingrown nail 03/15/2015   Lower leg DVT (deep venous thromboembolism), acute, right (HCC)    Major depressive disorder with single episode    Menopausal problem    Mild persistent asthma, uncomplicated    Morbid obesity (HCC)    Morton's neuroma of left foot 12/26/2012   Second and third intersoace neuromas noted.      Oral lichen planus 02/13/2018   OSA (obstructive sleep apnea)    Osteoarthritis    Overactive bladder    Pain in joint, ankle and foot 12/26/2012   Plantar fasciitis, left 12/26/2012   Managed with orthoses and NSAIDS    Plantar flexed metatarsal 06/18/2015   Radiculopathy, cervical region 05/01/2020   Restless leg syndrome    Spinal stenosis in cervical region 03/30/2020   Supplemental oxygen dependent    Surgery follow-up 06/18/2015    Past Surgical History:  Procedure Laterality Date    bladder tack     fibroid tumor     LAMINECTOMY  02/2013   SALIVARY GLAND SURGERY  01/17/2017   removed    Allergies  Allergen Reactions   Penicillins Hives   Clindamycin     Other reaction(s): GI intolerance   Prednisone Other (See Comments)    Contraindicated while on MTX per Dr. Reche Dixon (dermatologist)   Sulfa Antibiotics Rash    Physical Exam: General: The patient is alert and oriented x3 in no acute distress.  Dermatology: Skin is warm, dry and supple bilateral lower extremities. Interspaces are clear of maceration and debris.  The left hallux nail is 3 mm thick with yellow discoloration, subungual debris, distal onycholysis and pain with compression.  The right hallux nail has abnormal morphology with pain on compression of the nail plate.  No surrounding erythema or signs of infection are seen.  Vascular: Palpable pedal pulses bilaterally. Capillary refill within normal limits.  No appreciable edema.  No erythema or calor.  Neurological: Light touch sensation grossly intact bilateral feet.   Musculoskeletal Exam: Minimal pain on palpation to the right second toe at the DIPJ level.  Toe is in good position and there is minimal edema compared to last visit.  Radiographic Exam (right foot, 3 weightbearing views, 11/02/2022):  Normal osseous mineralization.  The fracture  at the neck of the middle phalanx is in good position.  There is small amount of bone callus formation seen.  This appears to be healing well and showing normal signs of bone healing.  Assessment/Plan of Care: 1. Closed nondisplaced fracture of middle phalanx of lesser toe of right foot, initial encounter   2. Onychomycosis of great toe   3. Onychodystrophy      Meds ordered this encounter  Medications   Ciclopirox 0.77 % gel    Sig: Apply to both feet and between toes bid for 4 weeks.    Dispense:  45 g    Refill:  0   Discussed clinical and radiographic findings with patient today.  She will continue with  buddy splinting the toe.  She can slowly wean back into regular shoe gear as tolerated.  A prescription for ciclopirox sent and that she can apply to the nail daily.  Discussed how to apply the medication.  Will follow-up in 3 months for fungal nail recheck.  Will postpone removal of the right hallux nail until after her trip to the beach.  The procedure was discussed in detail today.  She will call to schedule this after her vacation trip   Arnell Mausolf DBurna Mortimer, DPM, FACFAS [facility]     [address] [place], [place] [address]                Office [phone]  Fax [phone]

## 2022-11-08 ENCOUNTER — Other Ambulatory Visit: Payer: Self-pay | Admitting: Podiatry

## 2022-11-14 ENCOUNTER — Ambulatory Visit: Payer: BC Managed Care – PPO | Admitting: Podiatry

## 2022-11-21 ENCOUNTER — Encounter: Payer: Self-pay | Admitting: Podiatry

## 2022-11-21 ENCOUNTER — Ambulatory Visit: Payer: BC Managed Care – PPO | Admitting: Podiatry

## 2022-11-21 DIAGNOSIS — M2042 Other hammer toe(s) (acquired), left foot: Secondary | ICD-10-CM

## 2022-11-21 DIAGNOSIS — M778 Other enthesopathies, not elsewhere classified: Secondary | ICD-10-CM | POA: Diagnosis not present

## 2022-11-21 MED ORDER — CICLOPIROX 8 % EX SOLN: Freq: Every day | CUTANEOUS | 2 refills | Status: DC

## 2022-11-21 NOTE — Patient Instructions (Signed)
Same restrictions for work

## 2022-11-29 NOTE — Progress Notes (Signed)
Subjective: Chief Complaint  Patient presents with   Capsulitis of foot, left    Pt stated that she still has some discomfort with her foot      Darlene Lawson is a 62 y.o. is seen today for follow-up evaluation of ongoing foot pain the left side.  Also for help evaluation fracture of the right foot which is doing better.  Her main concern is still having pain to the left foot and she works 4 hours a week and after that she states that her foot hurts and she is not able to work past that time.  No new injuries or concerns otherwise.  Objective: General: No acute distress, AAOx3  DP/PT pulses palpable 2/4, CRT < 3 sec to all digits.  Protective sensation intact. Motor function intact.  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  There is also adductovarus noted to the fifth toe.  Injury discomfort is localized to the fourth toe.  There is no area pinpoint tenderness. Right foot: Area the previous laceration that she had has resolved.  There is no some edema is no erythema, drainage or pus or any signs of infection noted today. No pain with calf compression, swelling, warmth, erythema.   Assessment and Plan:  Chronic left foot pain, arthritis; right foot injury  -Treatment options discussed including all alternatives, risks, and complications -On the right foot her symptoms are much improved.  She is back to her regular shoe.  On the left foot her symptoms are chronic.  We can discuss further surgical intervention but she is not interested in doing at this time.  Discussed other medical issues as well as she is currently working on.  For now continue supportive shoes, offloading pads.  Return in about 3 months (around 02/21/2023).  Vivi Barrack DPM

## 2022-12-05 ENCOUNTER — Other Ambulatory Visit: Payer: Self-pay | Admitting: Podiatry

## 2022-12-27 ENCOUNTER — Other Ambulatory Visit: Payer: Self-pay | Admitting: Cardiology

## 2023-01-02 ENCOUNTER — Other Ambulatory Visit: Payer: Self-pay | Admitting: Podiatry

## 2023-01-18 ENCOUNTER — Other Ambulatory Visit: Payer: Self-pay | Admitting: Podiatry

## 2023-02-02 ENCOUNTER — Ambulatory Visit: Payer: BC Managed Care – PPO | Admitting: Podiatry

## 2023-03-02 ENCOUNTER — Ambulatory Visit: Payer: BC Managed Care – PPO | Admitting: Podiatry

## 2023-03-06 ENCOUNTER — Ambulatory Visit: Payer: BC Managed Care – PPO | Admitting: Podiatry

## 2023-03-06 ENCOUNTER — Encounter: Payer: Self-pay | Admitting: Podiatry

## 2023-03-06 DIAGNOSIS — M2042 Other hammer toe(s) (acquired), left foot: Secondary | ICD-10-CM

## 2023-03-06 DIAGNOSIS — M79672 Pain in left foot: Secondary | ICD-10-CM | POA: Diagnosis not present

## 2023-03-06 DIAGNOSIS — M2041 Other hammer toe(s) (acquired), right foot: Secondary | ICD-10-CM

## 2023-03-06 DIAGNOSIS — G8929 Other chronic pain: Secondary | ICD-10-CM | POA: Diagnosis not present

## 2023-03-07 ENCOUNTER — Ambulatory Visit: Payer: BC Managed Care – PPO | Admitting: Podiatry

## 2023-03-08 NOTE — Progress Notes (Signed)
Subjective: Chief Complaint  Patient presents with   Foot Pain    RM#11 Left foot pain follow up patient states is doing okay pain scale 3 with normal activity.      Darlene Lawson is a 62 y.o. is seen today for follow-up evaluation of ongoing foot pain the left side.  She still has her simply curling more but she is not ready to have surgery on her toes.  She also gets swelling of the toes of the right foot as well.  She was seen by increasing her hours at work however she states that she will work 2 days back to back and she has a lot of discomfort.  The days that she is not walking on hard surfaces her feet seem to do much better. No recent injury or changes otherwise.    Objective: General: No acute distress, AAOx3  DP/PT pulses palpable 2/4, CRT < 3 sec to all digits.  Protective sensation intact. Motor function intact.  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  There is also adductovarus noted to the fifth toe.  There is no area pinpoint tenderness. Right foot: Hammertoe contractures are also present on the right foot with some occasional discomfort but no significant pain on exam today. No pain with calf compression, swelling, warmth, erythema.   Assessment and Plan:  Bilateral digital formally Worsening Right  -Treatment options discussed including all alternatives, risks, and complications -We discussed the conservative as well as surgical treatment options.  She is not ready to pursue any further surgery.  Continue hammertoe pads, shoe modifications with good support.  If symptoms persist would recommend surgical invention.  For now we will keep on her current work restrictions.   No follow-ups on file.  Vivi Barrack DPM

## 2023-04-17 ENCOUNTER — Other Ambulatory Visit: Payer: Self-pay | Admitting: Podiatry

## 2023-05-02 ENCOUNTER — Ambulatory Visit: Payer: BC Managed Care – PPO | Admitting: Cardiology

## 2023-05-04 ENCOUNTER — Encounter: Payer: Self-pay | Admitting: Cardiology

## 2023-05-04 NOTE — Progress Notes (Signed)
 Cardiology Office Note:    Date:  05/05/2023   ID:  Darlene Lawson, DOB 1960/12/20, MRN [id number]  PCP:  Street, Lonni HERO, MD  Cardiologist:  Redell Leiter, MD    Referring MD: [address], Lonni HERO, *    ASSESSMENT:    1. Chronic diastolic heart failure (HCC)   2. PAF (paroxysmal atrial fibrillation) (HCC)   3. Chronic anticoagulation   4. Mild CAD   5. Dyslipidemia   6. OSA (obstructive sleep apnea)    PLAN:    In order of problems listed above:  Bobbie has done quite well with heart failure meticulous with sodium restriction weighs daily and her diuretic is not having edema and is much improved with combined therapy for heart failure and COPD not being bothered with shortness of breath. She will continue her current loop diuretic labs are being followed through her primary care physician. Maintaining sinus rhythm currently not on anticoagulant. Stable COPD having no angina continue medical therapy including her low-dose aspirin  or statin lipids are at target and not requiring any antianginal medications Continue her statin LDL is at target COPD and sleep apnea managed with her pulmonologist   Next appointment: 6 months   Medication Adjustments/Labs and Tests Ordered: Current medicines are reviewed at length with the patient today.  Concerns regarding medicines are outlined above.  No orders of the defined types were placed in this encounter.  No orders of the defined types were placed in this encounter.    History of Present Illness:    [name] is a 63 y.o. female with a hx of chronic diastolic heart failure EF 55 to 60% mild CAD DVT in January 2022 associated COVID-19 infection paroxysmal atrial fibrillation and obstructive sleep apnea last seen 08/18/2022.  Compliance with diet, lifestyle and medications: Yes  Present being treated for respiratory infection This point that she has done well she is not short of breath no edema orthopnea chest pain  palpitation or syncope With treatment of COPD she is much improved and is not having bronchospasm Recent labs 01/30/2023 cholesterol 146 LDL 64 hemoglobin 14.7 creatinine 0.9 potassium 4.1 Past Medical History:  Diagnosis Date   Acquired thrombophilia (HCC)    Allergic rhinitis with postnasal drip    Anxiety    Aortic atherosclerosis (HCC)    Cellulitis of right lower extremity without foot    Chronic insomnia    Chronic respiratory failure with hypoxia (HCC)    DOE (dyspnea on exertion)    Erythema multiforme    GERD (gastroesophageal reflux disease)    Hammertoe 06/18/2015   History of erythema multiforme 12/18/2019   Hypothyroidism    Ingrown nail 03/15/2015   Lower leg DVT (deep venous thromboembolism), acute, right (HCC)    Major depressive disorder with single episode    Menopausal problem    Mild persistent asthma, uncomplicated    Morbid obesity (HCC)    Morbid obesity due to excess calories (HCC)    Morton's neuroma of left foot 12/26/2012   Second and third intersoace neuromas noted.      Oral lichen planus 02/13/2018   OSA (obstructive sleep apnea)    Osteoarthritis    Overactive bladder    Pain in joint, ankle and foot 12/26/2012   Plantar fasciitis, left 12/26/2012   Managed with orthoses and NSAIDS    Plantar flexed metatarsal 06/18/2015   Radiculopathy, cervical region 05/01/2020   Restless leg syndrome    Spinal stenosis in cervical region 03/30/2020   Supplemental oxygen  dependent    Surgery follow-up 06/18/2015    Current Medications: Current Meds  Medication Sig   albuterol (VENTOLIN HFA) 108 (90 Base) MCG/ACT inhaler Inhale 2 puffs into the lungs every 6 (six) hours as needed for wheezing or shortness of breath.   amphetamine-dextroamphetamine (ADDERALL XR) 20 MG 24 hr capsule Take 20 mg by mouth daily.   ascorbic acid (VITAMIN C) 500 MG tablet Take 500 mg by mouth daily.   aspirin  EC 81 MG tablet Take 1 tablet (81 mg total) by mouth daily. Swallow  whole.   Azelastine HCl 137 MCG/SPRAY SOLN Place 2 sprays into both nostrils 2 (two) times daily.   BINAXNOW COVID-19 AG HOME TEST KIT Use as Directed on the Package   Biotin 5 MG TBDP Take 5 mg by mouth daily.   BREO ELLIPTA 100-25 MCG/ACT AEPB Inhale 1 puff into the lungs daily.   buPROPion (WELLBUTRIN XL) 300 MG 24 hr tablet Take 300 mg by mouth daily.   Calcium Carb-Cholecalciferol 600-400 MG-UNIT TABS Take 1 tablet by mouth daily.   celecoxib  (CELEBREX ) 200 MG capsule Take 200 mg by mouth 2 (two) times daily.   Cholecalciferol 125 MCG (5000 UT) TABS Take 5,000 Units by mouth daily.   ciclopirox  (PENLAC ) 8 % solution Apply topically at bedtime. Apply over nail and surrounding skin. Apply daily over previous coat. After seven (7) days, may remove with alcohol  and continue cycle.   Ciclopirox  0.77 % gel Apply to both feet and between toes bid for 4 weeks.   clobetasol cream (TEMOVATE) 0.05 % Apply 1 application topically 2 (two) times daily as needed (Rash).   clorazepate (TRANXENE) 3.75 MG tablet Take 3.75 mg by mouth at bedtime as needed for anxiety.   clotrimazole (MYCELEX) 10 MG troche Take 10 mg by mouth daily as needed (Mouth sore).   COLLAGEN PO Take 3 tablets by mouth daily.   cycloSPORINE (SANDIMMUNE) 100 MG capsule Take 100 mg by mouth daily as needed (Breakout).   diclofenac Sodium (VOLTAREN) 1 % GEL SMARTSIG:4 Gram(s) Topical 1-4 Times Daily   esomeprazole (NEXIUM) 20 MG capsule Take 20 mg by mouth 2 (two) times daily before a meal.   estradiol-norethindrone (ACTIVELLA) 1-0.5 MG tablet Take 1 tablet by mouth daily.   finasteride (PROSCAR) 5 MG tablet Take 5 mg by mouth daily.   FLOVENT DISKUS 100 MCG/ACT AEPB Take 1 puff by mouth 2 (two) times daily.   fluconazole (DIFLUCAN) 150 MG tablet Take 150 mg by mouth once as needed (Yeast Infection).   fluticasone (FLONASE) 50 MCG/ACT nasal spray Place 2 sprays into both nostrils daily as needed for allergies or rhinitis.   folic acid  (FOLVITE) 1 MG tablet Take 1 mg by mouth daily.   furosemide  (LASIX ) 40 MG tablet Take 1 tablet (40 mg total) by mouth 2 (two) times daily.   gabapentin  (NEURONTIN ) 300 MG capsule TAKE 1 CAPSULE BY MOUTH EVERY DAY AT BEDTIME   ketoconazole (NIZORAL) 2 % shampoo Apply 1 Application topically 3 (three) times a week.   Krill Oil Ultra Strength 1500 MG CAPS Take 1 capsule by mouth daily.   levothyroxine (SYNTHROID) 200 MCG tablet Take 200 mcg by mouth daily.   loratadine-pseudoephedrine (CLARITIN-D 24-HOUR) 10-240 MG 24 hr tablet Take 1 tablet by mouth daily as needed for allergies.   Magnesium 250 MG TABS Take 250 mg by mouth 2 (two) times daily.   meloxicam  (MOBIC ) 7.5 MG tablet Take 1 tablet by mouth once daily   methotrexate (RHEUMATREX)  2.5 MG tablet Take 2.5 mg by mouth once a week. Take 3 tablets (7.5 mg) Sunday's   MONTELUKAST SODIUM PO Take 1 tablet by mouth daily.   polyethylene glycol (MIRALAX / GLYCOLAX) 17 g packet Take 17 g by mouth 2 (two) times daily.   potassium chloride  (KLOR-CON ) 10 MEQ tablet Take 1 tablet by mouth once daily   pravastatin  (PRAVACHOL ) 20 MG tablet Take 1 tablet (20 mg total) by mouth every evening.   rOPINIRole (REQUIP) 2 MG tablet Take 2 mg by mouth 2 (two) times daily.   tacrolimus (PROGRAF) 1 MG capsule Take 1 mg by mouth every 12 (twelve) hours as needed (Breakout rash / mout sore).   traZODone (DESYREL) 100 MG tablet Take 100 mg by mouth at bedtime.   VESICARE 10 MG tablet Take 10 mg by mouth at bedtime.   Vibegron (GEMTESA) 75 MG TABS Take 1 tablet by mouth daily.   vitamin B-12 (CYANOCOBALAMIN) 500 MCG tablet Take 500 mcg by mouth daily.   vitamin E 180 MG (400 UNITS) capsule Take 400 Units by mouth daily.   Zinc 50 MG TABS Take 1 tablet by mouth daily.      EKGs/Labs/Other Studies Reviewed:    The following studies were reviewed today:  Cardiac Studies & Procedures        MONITORS  LONG TERM MONITOR (3-14 DAYS) 09/11/2020  Narrative Patch  Wear Time:  7 days and 0 hours (2022-06-06T16:31:06-398 to 2022-06-13T17:06:03-398)  Patient had a min HR of 59 bpm, max HR of 150 bpm, and avg HR of 81 bpm. Predominant underlying rhythm was Sinus Rhythm. First Degree AV Block was present. 12 Supraventricular Tachycardia runs occurred, the run with the fastest interval lasting 5 beats with a max rate of 150 bpm, the longest lasting 8 beats with an avg rate of 118 bpm. Supraventricular Tachycardia was detected within +/- 45 seconds of symptomatic patient event(s). Isolated SVEs were rare (<1.0%), SVE Couplets were rare (<1.0%), and SVE Triplets were rare (<1.0%). Isolated VEs were rare (<1.0%), and no VE Couplets or VE Triplets were present.  There were rare for triggered events all sinus rhythm 1 associated with a 4 beat run of atrial premature contractions. There were 12 brief runs of atrial premature contractions the longest 8 complexes at a rate of 118 bpm. Supraventricular ectopy was rare with no episodes of atrial fibrillation or flutter Ventricular ectopy was rare There were no pauses of more than 3 seconds and no episodes of second or third-degree AV nodal block or sinus node exit block.   Conclusion rare ventricular and supraventricular ectopy.  CT SCANS  CT CORONARY MORPH W/CTA COR W/SCORE 09/08/2020  Addendum 09/08/2020  5:02 PM ADDENDUM REPORT: 09/08/2020 17:00  HISTORY: Chest pain/anginal equiv, 21yr CHD risk > 20%, not treadmill candidate  EXAM: Cardiac/Coronary  CT  TECHNIQUE: The patient was scanned on a [employer].  PROTOCOL: A 120 kV prospective scan was triggered in the descending thoracic aorta at 111 HU's. Axial non-contrast 3 mm slices were carried out through the heart. The data set was analyzed on a dedicated work station and scored using the Agatston method. Gantry rotation speed was 250 msecs and collimation was .6 mm. Beta blockade and 0.8 mg of sl NTG was given. The 3D data set was  reconstructed in 5% intervals of the 35-75 % of the R-R cycle. Systolic and diastolic phases were analyzed on a dedicated work station using MPR, MIP and VRT modes. The patient received 100mL  OMNIPAQUE  IOHEXOL  350 MG/ML SOLN contrast.  FINDINGS: Image quality: Average, poor contrast bolus timing.  Artifact: Limited.  Coronary calcium score is 16, which places the patient in the 72nd percentile for age and sex matched control.  Coronary arteries: Normal coronary origins.  Right dominance.  Right Coronary Artery: No detectable plaque or stenosis.  Left Main Coronary Artery: No detectable plaque or stenosis.  Left Anterior Descending Coronary Artery: Mild mixed atherosclerotic plaque in the proximal LAD, 25-49% stenosis. No detectable plaque or stenosis in the mid LAD. Distal LAD is not well visualized but appears patent.  Left Circumflex Artery: No detectable plaque or stenosis.  Aorta: Normal size, 33 mm at the mid ascending aorta (level of the PA bifurcation) measured double oblique. No calcifications. No dissection.  Aortic Valve: Trivial calcifications.  Other findings:  Normal pulmonary vein drainage into the left atrium.  Normal left atrial appendage without a thrombus.  Normal size of the pulmonary artery.  IMPRESSION: 1.  Mild CAD in proximal LAD, CADRADS = 2.  2. Coronary calcium score is 16, which places the patient in the 72nd percentile for age and sex matched control.  3. Normal coronary origin with right dominance.  4.  LVEF approximately 58%.   Electronically Signed By: Soyla Merck On: 09/08/2020 17:00  Narrative EXAM: OVER-READ INTERPRETATION  CT CHEST  The following report is an over-read performed by radiologist Dr. Toribio Aye of [facility] on 09/08/2020. This over-read does not include interpretation of cardiac or coronary anatomy or pathology. The coronary calcium score/coronary CTA interpretation by the  cardiologist is attached.  COMPARISON:  None.  FINDINGS: Within the visualized portions of the thorax there are no suspicious appearing pulmonary nodules or masses, there is no acute consolidative airspace disease, no pleural effusions, no pneumothorax and no lymphadenopathy. Visualized portions of the upper abdomen demonstrates several subcentimeter low-attenuation lesions in the liver, too small to characterize, but statistically likely to represent small cysts. There are no aggressive appearing lytic or blastic lesions noted in the visualized portions of the skeleton. Old partially healed left-sided lateral rib fracture incidentally noted.  IMPRESSION: 1. No significant incidental noncardiac findings are noted.  Electronically Signed: By: Toribio Aye M.D. On: 09/08/2020 16:10              Recent Labs: 08/18/2022: ALT 26; BUN 17; Creatinine, Ser 0.82; Hemoglobin 13.8; NT-Pro BNP 346; Platelets 213; Potassium 4.1; Sodium 140  Recent Lipid Panel    Component Value Date/Time   CHOL 126 08/18/2022 1109   TRIG 51 08/18/2022 1109   HDL 50 08/18/2022 1109   CHOLHDL 2.5 08/18/2022 1109   LDLCALC 64 08/18/2022 1109    Physical Exam:    VS:  BP 128/88   Pulse 78   Ht 5' 2 (1.575 m)   Wt 227 lb 3.2 oz (103.1 kg)   SpO2 96%   BMI 41.56 kg/m     Wt Readings from Last 3 Encounters:  05/05/23 227 lb 3.2 oz (103.1 kg)  08/18/22 248 lb (112.5 kg)  10/28/21 242 lb (109.8 kg)     GEN:  Well nourished, well developed in no acute distress HEENT: Normal NECK: No JVD; No carotid bruits LYMPHATICS: No lymphadenopathy CARDIAC: RRR, no murmurs, rubs, gallops RESPIRATORY:  Clear to auscultation without rales, wheezing or rhonchi  ABDOMEN: Soft, non-tender, non-distended MUSCULOSKELETAL:  No edema; No deformity  SKIN: Warm and dry NEUROLOGIC:  Alert and oriented x 3 PSYCHIATRIC:  Normal affect    Signed,  Redell Leiter, MD  05/05/2023 4:54 PM    [facility]

## 2023-05-05 ENCOUNTER — Encounter: Payer: Self-pay | Admitting: Cardiology

## 2023-05-05 ENCOUNTER — Ambulatory Visit: Payer: BC Managed Care – PPO | Attending: Cardiology | Admitting: Cardiology

## 2023-05-05 VITALS — BP 128/88 | HR 78 | Ht 62.0 in | Wt 227.2 lb

## 2023-05-05 DIAGNOSIS — I2510 Atherosclerotic heart disease of native coronary artery without angina pectoris: Secondary | ICD-10-CM

## 2023-05-05 DIAGNOSIS — I5032 Chronic diastolic (congestive) heart failure: Secondary | ICD-10-CM | POA: Diagnosis not present

## 2023-05-05 DIAGNOSIS — I480 Paroxysmal atrial fibrillation: Secondary | ICD-10-CM

## 2023-05-05 DIAGNOSIS — E785 Hyperlipidemia, unspecified: Secondary | ICD-10-CM

## 2023-05-05 DIAGNOSIS — Z7901 Long term (current) use of anticoagulants: Secondary | ICD-10-CM

## 2023-05-05 DIAGNOSIS — G4733 Obstructive sleep apnea (adult) (pediatric): Secondary | ICD-10-CM

## 2023-05-05 NOTE — Patient Instructions (Signed)

## 2023-06-08 ENCOUNTER — Encounter: Payer: Self-pay | Admitting: Podiatry

## 2023-06-08 ENCOUNTER — Ambulatory Visit: Payer: BC Managed Care – PPO | Admitting: Podiatry

## 2023-06-08 DIAGNOSIS — G8929 Other chronic pain: Secondary | ICD-10-CM

## 2023-06-08 DIAGNOSIS — M2041 Other hammer toe(s) (acquired), right foot: Secondary | ICD-10-CM | POA: Diagnosis not present

## 2023-06-08 DIAGNOSIS — M79672 Pain in left foot: Secondary | ICD-10-CM

## 2023-06-08 DIAGNOSIS — L989 Disorder of the skin and subcutaneous tissue, unspecified: Secondary | ICD-10-CM | POA: Diagnosis not present

## 2023-06-08 DIAGNOSIS — M2042 Other hammer toe(s) (acquired), left foot: Secondary | ICD-10-CM | POA: Diagnosis not present

## 2023-06-08 NOTE — Progress Notes (Signed)
 Subjective: Chief Complaint  Patient presents with   Hammer Toe    RM#12 Follow up left foot very painful here to discuss treatment plan.     Darlene Lawson is a 63 y.o. is seen today for follow-up evaluation of ongoing foot pain the left side.  She has a callus on the ball of her foot which is causing quite a bit of pain.  States his symptoms are about the same with no significant changes.  She feels that she cannot work longer hours at this time as when she tries that she gets increased pain to her feet she has other medical issues as well.  I saw her last she had cellulitis of her right leg which has been treated by her primary care provider.  She has a wound on the left leg that she states started after a bug bite last fall and still present.  She states it itches like crazy and that she keeps it covered.  Denies any drainage or pus.  Objective: General: No acute distress, AAOx3  DP/PT pulses palpable 2/4, CRT < 3 sec to all digits.  Protective sensation intact. Motor function intact.  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  There is also adductovarus noted to the fifth toe.  There is no area pinpoint tenderness.  Most of the pain is to the fourth toe with swelling.  In general after being on her feet she gets diffuse foot pain.  Hyperkeratotic lesion submetatarsal 2 without any underlying ulceration, drainage or signs of infection.  No area pinpoint tenderness today.  On the lateral aspect of the left calf there is a ulceration measuring 0.4 x 0.3 cm without any surrounding erythema, ascending cellulitis.  No fluctuation or crepitation. Right foot: Hammertoe contractures are also present on the right foot with some occasional discomfort but no significant pain on exam today.  No apparent cellulitis of the right leg today. No pain with calf compression, swelling, warmth, erythema.   Assessment and Plan:  Left foot pain -Continue shoes, support as well as  offloading devices for the toes. -Hyperkeratotic lesion sharply debrided without any complications or bleeding.- Salicylic acid and bandage.  Postprocedure instructions discussed.  Monitoring signs or symptoms of infection. -Moisturizer, offloading.  Dispensed offloading pads. -Updated letter provided for work.   Right leg cellulitis -Appears to have much improved.  Monitor for reoccurrence.  She was on clindamycin for this.  Left lateral leg ulcer -This been ongoing now for many months.  She has a dermatologist and recommended follow-up with them. If needed I can place a referral if she is not able to get in. She is to let me know. Monitor for any clinical signs or symptoms of infection and directed to call the office immediately should any occur or go to the ER.  Vivi Barrack DPM

## 2023-06-22 ENCOUNTER — Other Ambulatory Visit: Payer: Self-pay | Admitting: Cardiology

## 2023-07-11 ENCOUNTER — Other Ambulatory Visit: Payer: Self-pay | Admitting: Cardiology

## 2023-07-12 ENCOUNTER — Other Ambulatory Visit: Payer: Self-pay | Admitting: Podiatry

## 2023-08-06 ENCOUNTER — Other Ambulatory Visit: Payer: Self-pay | Admitting: Cardiology

## 2023-09-07 ENCOUNTER — Ambulatory Visit: Admitting: Podiatry

## 2023-09-07 ENCOUNTER — Encounter: Payer: Self-pay | Admitting: Podiatry

## 2023-09-07 ENCOUNTER — Ambulatory Visit (INDEPENDENT_AMBULATORY_CARE_PROVIDER_SITE_OTHER)

## 2023-09-07 DIAGNOSIS — B351 Tinea unguium: Secondary | ICD-10-CM | POA: Diagnosis not present

## 2023-09-07 DIAGNOSIS — R609 Edema, unspecified: Secondary | ICD-10-CM

## 2023-09-07 DIAGNOSIS — M2042 Other hammer toe(s) (acquired), left foot: Secondary | ICD-10-CM

## 2023-09-07 MED ORDER — EFINACONAZOLE 10 % EX SOLN: 1.0000 [drp] | Freq: Every day | CUTANEOUS | 11 refills | Status: AC

## 2023-09-07 MED ORDER — GABAPENTIN 300 MG PO CAPS: 300.0000 mg | ORAL_CAPSULE | Freq: Two times a day (BID) | ORAL | 2 refills | Status: AC

## 2023-09-07 NOTE — Progress Notes (Signed)
 Subjective: Chief Complaint  Patient presents with   Hammer Toe    Rm Patient is here for left index toe (sizeable gap from the left hallux). Patient states pain at the top of left foot and swelling by the left ankle.     Darlene Lawson is a 63 y.o. is seen today for follow-up evaluation of ongoing foot pain the left side. Should has been experiencing increased discomfort to the left foot without any injuries.  She is not swelling but she is noticed increased swelling to both of her legs.  She has had cellulitis on the right side has been on multiple rounds of antibiotics for this.  No new treatment for her foot.  She has a wound on the left leg that she states started after a bug bite last fall and still present.  She followed up with dermatology for this.  She is concerned on nail fungus.  Objective: General: No acute distress, AAOx3  DP/PT pulses palpable 2/4, CRT < 3 sec to all digits.  Protective sensation intact. Motor function intact.  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  There is also adductovarus noted to the fifth toe.  She has tenderness palpation of the second, third MTPJ.  There is localized edema with no erythema or warmth.  Not able to push any area pinpoint tenderness. Right foot: Hammertoe contractures are also present on the right foot with some occasional discomfort but no significant pain on exam today.  Nails are dystrophic, hypertrophic with yellow, brown discoloration.  No edema, erythema to the toenail sites. No pain with calf compression, swelling, warmth, erythema.   Assessment and Plan:  -X-rays obtained reviewed.  Multiple views obtained.  Arthritic changes present at the MTPJ with digital contractures present.  No evidence of acute fracture otherwise. -We discussed with conservative as well as surgical options.  She does not want to pursue any surgery at this time.  I do think some of the swelling to her foot could be contributing  from the leg swelling.  Says compression, elevation.  She has a referral already placed for vascular to check on the swelling as well as recurrent cellulitis of the right lower extremity.  As far as her foot pain we discussed topical creams, offloading.  Continue supportive shoe gear.  Organ to keep her current work restrictions for now. -I had a sample of Jublia  that I dispensed.Lot [id number] exp09/2025

## 2023-10-08 ENCOUNTER — Other Ambulatory Visit: Payer: Self-pay | Admitting: Cardiology

## 2023-12-12 ENCOUNTER — Ambulatory Visit: Admitting: Podiatry

## 2024-01-01 ENCOUNTER — Ambulatory Visit: Admitting: Podiatry

## 2024-01-01 DIAGNOSIS — L97929 Non-pressure chronic ulcer of unspecified part of left lower leg with unspecified severity: Secondary | ICD-10-CM

## 2024-01-01 DIAGNOSIS — M2042 Other hammer toe(s) (acquired), left foot: Secondary | ICD-10-CM

## 2024-01-01 DIAGNOSIS — B351 Tinea unguium: Secondary | ICD-10-CM | POA: Diagnosis not present

## 2024-01-02 NOTE — Progress Notes (Signed)
 Subjective: Chief Complaint  Patient presents with   Toe Pain    Pt stated that her toe is still bothering her     Darlene Lawson is a 63 y.o. is seen today for follow-up evaluation of ongoing foot pain the left side.  She reports majority of symptoms are to her fourth toe and she is walking the toes today which causes pain.  She is not ready to proceed with any surgery at this time as she is can be having knee surgery soon.  She has venous insufficiency and she continues to get ongoing wounds.  She also has ongoing callus of the right big toe which causes discomfort.  No open lesions otherwise to her feet.   Objective: General: No acute distress, AAOx3  DP/PT pulses palpable 2/4, CRT < 3 sec to all digits.  Protective sensation intact. Motor function intact.  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  There is also adductovarus noted to the fifth toe.  She has tenderness palpation of the second, third MTPJ, which is chronic but majority of symptoms are to the lateral fourth toe from where there is adductovarus of the toe.  Right foot: Hammertoe contractures are also present on the right foot. Hyperkeratotic lesion noted along the medial hallux any underlying ulceration, drainage or signs of infection. No pain with calf compression, swelling, warmth, erythema.   Assessment and Plan:  - We discussed with conservative as well as surgical options for the left fourth toe but she is not ready to proceed with any surgery given upcoming knee surgeries. -Continue offloading -Sharply debrided the callus on the right hallux without any complications or bleeding. Urea cream. -Continue Jublia  for toenails -She is having upcoming knee surgery and at this open her venous insufficiency as well as superficial wounds on the left leg.  Referral to the wound care center to try get this healed prior to her surgery but will defer to her knee surgeon.  Return in about 3 months (around  04/02/2024).

## 2024-01-23 ENCOUNTER — Encounter (HOSPITAL_BASED_OUTPATIENT_CLINIC_OR_DEPARTMENT_OTHER): Admitting: Internal Medicine

## 2024-03-10 ENCOUNTER — Other Ambulatory Visit: Payer: Self-pay | Admitting: Cardiology

## 2024-03-31 ENCOUNTER — Other Ambulatory Visit: Payer: Self-pay | Admitting: Cardiology

## 2024-04-01 ENCOUNTER — Ambulatory Visit: Admitting: Podiatry

## 2024-04-25 ENCOUNTER — Ambulatory Visit (INDEPENDENT_AMBULATORY_CARE_PROVIDER_SITE_OTHER): Payer: Self-pay | Admitting: Podiatry

## 2024-04-25 DIAGNOSIS — M2042 Other hammer toe(s) (acquired), left foot: Secondary | ICD-10-CM | POA: Diagnosis not present

## 2024-04-27 NOTE — Progress Notes (Signed)
 Subjective: Chief Complaint  Patient presents with   Toe Ulcer    LT 4th toe. Using the toe separator, but still having pressure. She is having her LT knee replaced next Thursday. Not diabetic. Takes Xarelto .     Darlene Lawson is a 64 y.o. is seen today for follow-up evaluation of ongoing foot pain the left side, particularly her fourth toe.  She states that she does at some point she is in the left both the fourth toe slightly doubt that she is going to have an upcoming knee replacement as well as eye surgery he and she is not able to proceed with a toe surgery at this time.  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  She does not report any recent injuries or changes.  No other concerns today.   Objective: General: No acute distress, AAOx3  DP/PT pulses palpable 2/4, CRT < 3 sec to all digits.  Protective sensation intact. Motor function intact.  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  This is where she has majority of tenderness.  There is mild chronic edema present to the toe but no erythema or warmth.  Not able to appreciate any other areas of pinpoint tenderness today.  No pain with calf compression, swelling, warmth, erythema.   Assessment and Plan:  - We discussed with conservative as well as surgical options for the left fourth toe but she is not ready to proceed with any surgery given upcoming knee surgery.  For now want her to continue with offloading pads as well as shoes to avoid any excess pressure on the toe.  Once she is able we will proceed with treatment of the left fourth toe.  We discussed surgery today but we will hold off on this for now.  Return in about 3 months (around 07/24/2024).  Darlene Lawson DPM

## 2024-04-29 ENCOUNTER — Other Ambulatory Visit: Payer: Self-pay | Admitting: Cardiology

## 2024-07-25 ENCOUNTER — Ambulatory Visit: Admitting: Podiatry
